# Patient Record
Sex: Male | Born: 1945 | Race: White | Hispanic: Yes | Marital: Married | Smoking: Never smoker
Health system: Southern US, Community
[De-identification: ages and names within clinical notes are randomized; demographics above are authoritative.]

## PROBLEM LIST (undated history)

## (undated) DIAGNOSIS — E669 Obesity, unspecified: Secondary | ICD-10-CM

## (undated) DIAGNOSIS — M7120 Synovial cyst of popliteal space [Baker], unspecified knee: Secondary | ICD-10-CM

## (undated) DIAGNOSIS — R42 Dizziness and giddiness: Secondary | ICD-10-CM

## (undated) DIAGNOSIS — Z923 Personal history of irradiation: Secondary | ICD-10-CM

## (undated) DIAGNOSIS — C61 Malignant neoplasm of prostate: Secondary | ICD-10-CM

## (undated) DIAGNOSIS — M1990 Unspecified osteoarthritis, unspecified site: Secondary | ICD-10-CM

## (undated) DIAGNOSIS — R531 Weakness: Secondary | ICD-10-CM

## (undated) DIAGNOSIS — Z950 Presence of cardiac pacemaker: Secondary | ICD-10-CM

## (undated) DIAGNOSIS — G4733 Obstructive sleep apnea (adult) (pediatric): Secondary | ICD-10-CM

## (undated) DIAGNOSIS — I480 Paroxysmal atrial fibrillation: Secondary | ICD-10-CM

## (undated) DIAGNOSIS — T402X5A Adverse effect of other opioids, initial encounter: Secondary | ICD-10-CM

## (undated) DIAGNOSIS — R351 Nocturia: Secondary | ICD-10-CM

## (undated) DIAGNOSIS — R0602 Shortness of breath: Secondary | ICD-10-CM

## (undated) DIAGNOSIS — K5903 Drug induced constipation: Secondary | ICD-10-CM

## (undated) DIAGNOSIS — Z87442 Personal history of urinary calculi: Secondary | ICD-10-CM

## (undated) DIAGNOSIS — R0600 Dyspnea, unspecified: Secondary | ICD-10-CM

## (undated) DIAGNOSIS — IMO0002 Reserved for concepts with insufficient information to code with codable children: Secondary | ICD-10-CM

## (undated) DIAGNOSIS — R001 Bradycardia, unspecified: Secondary | ICD-10-CM

## (undated) DIAGNOSIS — E785 Hyperlipidemia, unspecified: Secondary | ICD-10-CM

## (undated) DIAGNOSIS — J302 Other seasonal allergic rhinitis: Secondary | ICD-10-CM

## (undated) DIAGNOSIS — I2510 Atherosclerotic heart disease of native coronary artery without angina pectoris: Secondary | ICD-10-CM

## (undated) DIAGNOSIS — M2550 Pain in unspecified joint: Secondary | ICD-10-CM

## (undated) DIAGNOSIS — I499 Cardiac arrhythmia, unspecified: Secondary | ICD-10-CM

## (undated) DIAGNOSIS — R609 Edema, unspecified: Secondary | ICD-10-CM

## (undated) DIAGNOSIS — I495 Sick sinus syndrome: Secondary | ICD-10-CM

## (undated) DIAGNOSIS — E559 Vitamin D deficiency, unspecified: Secondary | ICD-10-CM

## (undated) DIAGNOSIS — R0989 Other specified symptoms and signs involving the circulatory and respiratory systems: Secondary | ICD-10-CM

## (undated) DIAGNOSIS — M549 Dorsalgia, unspecified: Secondary | ICD-10-CM

## (undated) HISTORY — DX: Dyspnea, unspecified: R06.00

## (undated) HISTORY — DX: Reserved for concepts with insufficient information to code with codable children: IMO0002

## (undated) HISTORY — DX: Malignant neoplasm of prostate: C61

## (undated) HISTORY — DX: Atherosclerotic heart disease of native coronary artery without angina pectoris: I25.10

## (undated) HISTORY — DX: Weakness: R53.1

## (undated) HISTORY — DX: Vitamin D deficiency, unspecified: E55.9

## (undated) HISTORY — DX: Nocturia: R35.1

## (undated) HISTORY — DX: Edema, unspecified: R60.9

## (undated) HISTORY — DX: Obesity, unspecified: E66.9

## (undated) HISTORY — DX: Bradycardia, unspecified: R00.1

## (undated) HISTORY — PX: OTHER SURGICAL HISTORY: SHX169

## (undated) HISTORY — DX: Dorsalgia, unspecified: M54.9

## (undated) HISTORY — DX: Dizziness and giddiness: R42

## (undated) HISTORY — DX: Unspecified osteoarthritis, unspecified site: M19.90

## (undated) HISTORY — DX: Shortness of breath: R06.02

## (undated) HISTORY — DX: Hyperlipidemia, unspecified: E78.5

## (undated) HISTORY — DX: Presence of cardiac pacemaker: Z95.0

## (undated) HISTORY — DX: Paroxysmal atrial fibrillation: I48.0

## (undated) HISTORY — DX: Sick sinus syndrome: I49.5

## (undated) HISTORY — DX: Pain in unspecified joint: M25.50

## (undated) HISTORY — DX: Obstructive sleep apnea (adult) (pediatric): G47.33

---

## 2004-10-28 HISTORY — PX: LITHOTRIPSY: SUR834

## 2010-10-28 DIAGNOSIS — I480 Paroxysmal atrial fibrillation: Secondary | ICD-10-CM

## 2010-10-28 DIAGNOSIS — C61 Malignant neoplasm of prostate: Secondary | ICD-10-CM

## 2010-10-28 HISTORY — DX: Paroxysmal atrial fibrillation: I48.0

## 2010-10-28 HISTORY — DX: Malignant neoplasm of prostate: C61

## 2011-10-29 DIAGNOSIS — Z923 Personal history of irradiation: Secondary | ICD-10-CM

## 2011-10-29 DIAGNOSIS — Z950 Presence of cardiac pacemaker: Secondary | ICD-10-CM

## 2011-10-29 HISTORY — DX: Presence of cardiac pacemaker: Z95.0

## 2011-10-29 HISTORY — DX: Personal history of irradiation: Z92.3

## 2011-11-11 HISTORY — PX: PACEMAKER INSERTION: SHX728

## 2012-03-16 HISTORY — PX: CHOLECYSTECTOMY: SHX55

## 2012-10-30 DIAGNOSIS — L570 Actinic keratosis: Secondary | ICD-10-CM | POA: Diagnosis not present

## 2012-10-30 DIAGNOSIS — B079 Viral wart, unspecified: Secondary | ICD-10-CM | POA: Diagnosis not present

## 2012-10-30 DIAGNOSIS — Z85828 Personal history of other malignant neoplasm of skin: Secondary | ICD-10-CM | POA: Diagnosis not present

## 2012-10-30 DIAGNOSIS — Z23 Encounter for immunization: Secondary | ICD-10-CM | POA: Diagnosis not present

## 2012-11-10 DIAGNOSIS — E785 Hyperlipidemia, unspecified: Secondary | ICD-10-CM | POA: Diagnosis not present

## 2012-11-10 DIAGNOSIS — I2510 Atherosclerotic heart disease of native coronary artery without angina pectoris: Secondary | ICD-10-CM | POA: Diagnosis not present

## 2012-11-10 DIAGNOSIS — C61 Malignant neoplasm of prostate: Secondary | ICD-10-CM | POA: Diagnosis not present

## 2012-11-10 DIAGNOSIS — G4730 Sleep apnea, unspecified: Secondary | ICD-10-CM | POA: Diagnosis not present

## 2012-11-10 DIAGNOSIS — R5381 Other malaise: Secondary | ICD-10-CM | POA: Diagnosis not present

## 2012-11-10 DIAGNOSIS — R5383 Other fatigue: Secondary | ICD-10-CM | POA: Diagnosis not present

## 2012-11-11 DIAGNOSIS — R0602 Shortness of breath: Secondary | ICD-10-CM | POA: Diagnosis not present

## 2012-11-11 DIAGNOSIS — E669 Obesity, unspecified: Secondary | ICD-10-CM | POA: Diagnosis not present

## 2012-11-11 DIAGNOSIS — R5383 Other fatigue: Secondary | ICD-10-CM | POA: Diagnosis not present

## 2012-11-11 DIAGNOSIS — G4730 Sleep apnea, unspecified: Secondary | ICD-10-CM | POA: Diagnosis not present

## 2012-11-11 DIAGNOSIS — I2510 Atherosclerotic heart disease of native coronary artery without angina pectoris: Secondary | ICD-10-CM | POA: Diagnosis not present

## 2012-11-11 DIAGNOSIS — E785 Hyperlipidemia, unspecified: Secondary | ICD-10-CM | POA: Diagnosis not present

## 2012-11-11 DIAGNOSIS — M899 Disorder of bone, unspecified: Secondary | ICD-10-CM | POA: Diagnosis not present

## 2012-11-17 DIAGNOSIS — C61 Malignant neoplasm of prostate: Secondary | ICD-10-CM | POA: Diagnosis not present

## 2012-11-17 DIAGNOSIS — I4891 Unspecified atrial fibrillation: Secondary | ICD-10-CM | POA: Diagnosis not present

## 2012-11-17 DIAGNOSIS — E669 Obesity, unspecified: Secondary | ICD-10-CM | POA: Diagnosis not present

## 2012-11-26 DIAGNOSIS — E669 Obesity, unspecified: Secondary | ICD-10-CM | POA: Diagnosis not present

## 2012-11-26 DIAGNOSIS — I4891 Unspecified atrial fibrillation: Secondary | ICD-10-CM | POA: Diagnosis not present

## 2012-11-26 DIAGNOSIS — G4733 Obstructive sleep apnea (adult) (pediatric): Secondary | ICD-10-CM | POA: Diagnosis not present

## 2012-11-26 DIAGNOSIS — J3089 Other allergic rhinitis: Secondary | ICD-10-CM | POA: Diagnosis not present

## 2012-12-09 DIAGNOSIS — L821 Other seborrheic keratosis: Secondary | ICD-10-CM | POA: Diagnosis not present

## 2012-12-09 DIAGNOSIS — L820 Inflamed seborrheic keratosis: Secondary | ICD-10-CM | POA: Diagnosis not present

## 2012-12-14 DIAGNOSIS — M999 Biomechanical lesion, unspecified: Secondary | ICD-10-CM | POA: Diagnosis not present

## 2012-12-14 DIAGNOSIS — M5126 Other intervertebral disc displacement, lumbar region: Secondary | ICD-10-CM | POA: Diagnosis not present

## 2012-12-14 DIAGNOSIS — IMO0002 Reserved for concepts with insufficient information to code with codable children: Secondary | ICD-10-CM | POA: Diagnosis not present

## 2012-12-15 DIAGNOSIS — M999 Biomechanical lesion, unspecified: Secondary | ICD-10-CM | POA: Diagnosis not present

## 2012-12-15 DIAGNOSIS — IMO0002 Reserved for concepts with insufficient information to code with codable children: Secondary | ICD-10-CM | POA: Diagnosis not present

## 2012-12-15 DIAGNOSIS — M5126 Other intervertebral disc displacement, lumbar region: Secondary | ICD-10-CM | POA: Diagnosis not present

## 2012-12-17 DIAGNOSIS — M999 Biomechanical lesion, unspecified: Secondary | ICD-10-CM | POA: Diagnosis not present

## 2012-12-18 DIAGNOSIS — M5126 Other intervertebral disc displacement, lumbar region: Secondary | ICD-10-CM | POA: Diagnosis not present

## 2012-12-18 DIAGNOSIS — IMO0002 Reserved for concepts with insufficient information to code with codable children: Secondary | ICD-10-CM | POA: Diagnosis not present

## 2012-12-18 DIAGNOSIS — M999 Biomechanical lesion, unspecified: Secondary | ICD-10-CM | POA: Diagnosis not present

## 2012-12-21 DIAGNOSIS — M999 Biomechanical lesion, unspecified: Secondary | ICD-10-CM | POA: Diagnosis not present

## 2012-12-21 DIAGNOSIS — IMO0002 Reserved for concepts with insufficient information to code with codable children: Secondary | ICD-10-CM | POA: Diagnosis not present

## 2012-12-25 DIAGNOSIS — M999 Biomechanical lesion, unspecified: Secondary | ICD-10-CM | POA: Diagnosis not present

## 2012-12-30 DIAGNOSIS — M5126 Other intervertebral disc displacement, lumbar region: Secondary | ICD-10-CM | POA: Diagnosis not present

## 2012-12-30 DIAGNOSIS — IMO0002 Reserved for concepts with insufficient information to code with codable children: Secondary | ICD-10-CM | POA: Diagnosis not present

## 2012-12-30 DIAGNOSIS — M999 Biomechanical lesion, unspecified: Secondary | ICD-10-CM | POA: Diagnosis not present

## 2012-12-31 DIAGNOSIS — IMO0002 Reserved for concepts with insufficient information to code with codable children: Secondary | ICD-10-CM | POA: Diagnosis not present

## 2012-12-31 DIAGNOSIS — M5126 Other intervertebral disc displacement, lumbar region: Secondary | ICD-10-CM | POA: Diagnosis not present

## 2013-01-01 DIAGNOSIS — M999 Biomechanical lesion, unspecified: Secondary | ICD-10-CM | POA: Diagnosis not present

## 2013-01-02 DIAGNOSIS — M545 Low back pain, unspecified: Secondary | ICD-10-CM | POA: Diagnosis not present

## 2013-01-06 DIAGNOSIS — R609 Edema, unspecified: Secondary | ICD-10-CM | POA: Diagnosis not present

## 2013-01-19 DIAGNOSIS — M999 Biomechanical lesion, unspecified: Secondary | ICD-10-CM | POA: Diagnosis not present

## 2013-01-19 DIAGNOSIS — IMO0002 Reserved for concepts with insufficient information to code with codable children: Secondary | ICD-10-CM | POA: Diagnosis not present

## 2013-01-19 DIAGNOSIS — M5126 Other intervertebral disc displacement, lumbar region: Secondary | ICD-10-CM | POA: Diagnosis not present

## 2013-01-20 DIAGNOSIS — R609 Edema, unspecified: Secondary | ICD-10-CM

## 2013-01-20 DIAGNOSIS — E669 Obesity, unspecified: Secondary | ICD-10-CM | POA: Diagnosis not present

## 2013-01-20 DIAGNOSIS — I4891 Unspecified atrial fibrillation: Secondary | ICD-10-CM | POA: Diagnosis not present

## 2013-01-20 DIAGNOSIS — M1990 Unspecified osteoarthritis, unspecified site: Secondary | ICD-10-CM | POA: Diagnosis not present

## 2013-01-20 DIAGNOSIS — R0602 Shortness of breath: Secondary | ICD-10-CM

## 2013-01-20 DIAGNOSIS — Z950 Presence of cardiac pacemaker: Secondary | ICD-10-CM | POA: Diagnosis not present

## 2013-01-20 HISTORY — DX: Edema, unspecified: R60.9

## 2013-01-20 HISTORY — DX: Shortness of breath: R06.02

## 2013-01-21 DIAGNOSIS — M5126 Other intervertebral disc displacement, lumbar region: Secondary | ICD-10-CM | POA: Diagnosis not present

## 2013-01-21 DIAGNOSIS — IMO0002 Reserved for concepts with insufficient information to code with codable children: Secondary | ICD-10-CM | POA: Diagnosis not present

## 2013-01-21 DIAGNOSIS — M999 Biomechanical lesion, unspecified: Secondary | ICD-10-CM | POA: Diagnosis not present

## 2013-01-25 DIAGNOSIS — M999 Biomechanical lesion, unspecified: Secondary | ICD-10-CM | POA: Diagnosis not present

## 2013-01-25 DIAGNOSIS — M5126 Other intervertebral disc displacement, lumbar region: Secondary | ICD-10-CM | POA: Diagnosis not present

## 2013-01-25 DIAGNOSIS — IMO0002 Reserved for concepts with insufficient information to code with codable children: Secondary | ICD-10-CM | POA: Diagnosis not present

## 2013-01-28 DIAGNOSIS — IMO0002 Reserved for concepts with insufficient information to code with codable children: Secondary | ICD-10-CM | POA: Diagnosis not present

## 2013-01-28 DIAGNOSIS — M5126 Other intervertebral disc displacement, lumbar region: Secondary | ICD-10-CM | POA: Diagnosis not present

## 2013-01-28 DIAGNOSIS — M999 Biomechanical lesion, unspecified: Secondary | ICD-10-CM | POA: Diagnosis not present

## 2013-02-04 DIAGNOSIS — M5126 Other intervertebral disc displacement, lumbar region: Secondary | ICD-10-CM | POA: Diagnosis not present

## 2013-02-04 DIAGNOSIS — IMO0002 Reserved for concepts with insufficient information to code with codable children: Secondary | ICD-10-CM | POA: Diagnosis not present

## 2013-02-04 DIAGNOSIS — M999 Biomechanical lesion, unspecified: Secondary | ICD-10-CM | POA: Diagnosis not present

## 2013-02-09 DIAGNOSIS — M5126 Other intervertebral disc displacement, lumbar region: Secondary | ICD-10-CM | POA: Diagnosis not present

## 2013-02-09 DIAGNOSIS — M999 Biomechanical lesion, unspecified: Secondary | ICD-10-CM | POA: Diagnosis not present

## 2013-02-09 DIAGNOSIS — IMO0002 Reserved for concepts with insufficient information to code with codable children: Secondary | ICD-10-CM | POA: Diagnosis not present

## 2013-02-15 DIAGNOSIS — C61 Malignant neoplasm of prostate: Secondary | ICD-10-CM | POA: Diagnosis not present

## 2013-02-16 DIAGNOSIS — IMO0002 Reserved for concepts with insufficient information to code with codable children: Secondary | ICD-10-CM | POA: Diagnosis not present

## 2013-02-16 DIAGNOSIS — M999 Biomechanical lesion, unspecified: Secondary | ICD-10-CM | POA: Diagnosis not present

## 2013-02-16 DIAGNOSIS — M5126 Other intervertebral disc displacement, lumbar region: Secondary | ICD-10-CM | POA: Diagnosis not present

## 2013-02-23 DIAGNOSIS — C61 Malignant neoplasm of prostate: Secondary | ICD-10-CM | POA: Diagnosis not present

## 2013-02-23 DIAGNOSIS — M5126 Other intervertebral disc displacement, lumbar region: Secondary | ICD-10-CM | POA: Diagnosis not present

## 2013-02-23 DIAGNOSIS — IMO0002 Reserved for concepts with insufficient information to code with codable children: Secondary | ICD-10-CM | POA: Diagnosis not present

## 2013-02-23 DIAGNOSIS — M999 Biomechanical lesion, unspecified: Secondary | ICD-10-CM | POA: Diagnosis not present

## 2013-02-23 DIAGNOSIS — E669 Obesity, unspecified: Secondary | ICD-10-CM | POA: Diagnosis not present

## 2013-03-02 DIAGNOSIS — M999 Biomechanical lesion, unspecified: Secondary | ICD-10-CM | POA: Diagnosis not present

## 2013-03-02 DIAGNOSIS — IMO0002 Reserved for concepts with insufficient information to code with codable children: Secondary | ICD-10-CM | POA: Diagnosis not present

## 2013-03-02 DIAGNOSIS — M5126 Other intervertebral disc displacement, lumbar region: Secondary | ICD-10-CM | POA: Diagnosis not present

## 2013-03-09 DIAGNOSIS — IMO0002 Reserved for concepts with insufficient information to code with codable children: Secondary | ICD-10-CM | POA: Diagnosis not present

## 2013-03-09 DIAGNOSIS — M5126 Other intervertebral disc displacement, lumbar region: Secondary | ICD-10-CM | POA: Diagnosis not present

## 2013-03-09 DIAGNOSIS — M999 Biomechanical lesion, unspecified: Secondary | ICD-10-CM | POA: Diagnosis not present

## 2013-03-16 DIAGNOSIS — M999 Biomechanical lesion, unspecified: Secondary | ICD-10-CM | POA: Diagnosis not present

## 2013-03-16 DIAGNOSIS — M5126 Other intervertebral disc displacement, lumbar region: Secondary | ICD-10-CM | POA: Diagnosis not present

## 2013-03-16 DIAGNOSIS — IMO0002 Reserved for concepts with insufficient information to code with codable children: Secondary | ICD-10-CM | POA: Diagnosis not present

## 2013-03-23 DIAGNOSIS — IMO0002 Reserved for concepts with insufficient information to code with codable children: Secondary | ICD-10-CM | POA: Diagnosis not present

## 2013-03-23 DIAGNOSIS — M5126 Other intervertebral disc displacement, lumbar region: Secondary | ICD-10-CM | POA: Diagnosis not present

## 2013-03-23 DIAGNOSIS — M999 Biomechanical lesion, unspecified: Secondary | ICD-10-CM | POA: Diagnosis not present

## 2013-03-30 DIAGNOSIS — IMO0002 Reserved for concepts with insufficient information to code with codable children: Secondary | ICD-10-CM | POA: Diagnosis not present

## 2013-03-30 DIAGNOSIS — M5126 Other intervertebral disc displacement, lumbar region: Secondary | ICD-10-CM | POA: Diagnosis not present

## 2013-03-30 DIAGNOSIS — M999 Biomechanical lesion, unspecified: Secondary | ICD-10-CM | POA: Diagnosis not present

## 2013-04-13 DIAGNOSIS — M999 Biomechanical lesion, unspecified: Secondary | ICD-10-CM | POA: Diagnosis not present

## 2013-04-13 DIAGNOSIS — IMO0002 Reserved for concepts with insufficient information to code with codable children: Secondary | ICD-10-CM | POA: Diagnosis not present

## 2013-04-13 DIAGNOSIS — M5126 Other intervertebral disc displacement, lumbar region: Secondary | ICD-10-CM | POA: Diagnosis not present

## 2013-04-27 DIAGNOSIS — M999 Biomechanical lesion, unspecified: Secondary | ICD-10-CM | POA: Diagnosis not present

## 2013-04-27 DIAGNOSIS — IMO0002 Reserved for concepts with insufficient information to code with codable children: Secondary | ICD-10-CM | POA: Diagnosis not present

## 2013-04-27 DIAGNOSIS — M5126 Other intervertebral disc displacement, lumbar region: Secondary | ICD-10-CM | POA: Diagnosis not present

## 2013-04-28 DIAGNOSIS — E785 Hyperlipidemia, unspecified: Secondary | ICD-10-CM | POA: Diagnosis not present

## 2013-04-28 DIAGNOSIS — L851 Acquired keratosis [keratoderma] palmaris et plantaris: Secondary | ICD-10-CM | POA: Diagnosis not present

## 2013-04-28 DIAGNOSIS — R110 Nausea: Secondary | ICD-10-CM | POA: Diagnosis not present

## 2013-04-28 DIAGNOSIS — I2510 Atherosclerotic heart disease of native coronary artery without angina pectoris: Secondary | ICD-10-CM | POA: Diagnosis not present

## 2013-04-29 DIAGNOSIS — D235 Other benign neoplasm of skin of trunk: Secondary | ICD-10-CM | POA: Diagnosis not present

## 2013-04-29 DIAGNOSIS — I8310 Varicose veins of unspecified lower extremity with inflammation: Secondary | ICD-10-CM | POA: Diagnosis not present

## 2013-04-29 DIAGNOSIS — Z85828 Personal history of other malignant neoplasm of skin: Secondary | ICD-10-CM | POA: Diagnosis not present

## 2013-05-05 DIAGNOSIS — M999 Biomechanical lesion, unspecified: Secondary | ICD-10-CM | POA: Diagnosis not present

## 2013-05-05 DIAGNOSIS — IMO0002 Reserved for concepts with insufficient information to code with codable children: Secondary | ICD-10-CM | POA: Diagnosis not present

## 2013-05-05 DIAGNOSIS — M5126 Other intervertebral disc displacement, lumbar region: Secondary | ICD-10-CM | POA: Diagnosis not present

## 2013-08-05 ENCOUNTER — Ambulatory Visit (INDEPENDENT_AMBULATORY_CARE_PROVIDER_SITE_OTHER): Payer: Medicare Other | Admitting: *Deleted

## 2013-08-05 DIAGNOSIS — I4891 Unspecified atrial fibrillation: Secondary | ICD-10-CM

## 2013-08-05 LAB — PACEMAKER DEVICE OBSERVATION
BATTERY VOLTAGE: 2.9629 V
BRDY-0002RV: 60 {beats}/min
RV LEAD AMPLITUDE: 12 mv
RV LEAD IMPEDENCE PM: 562.5 Ohm
VENTRICULAR PACING PM: 72

## 2013-08-05 NOTE — Progress Notes (Signed)
Pacemaker check in clinic. Normal device function. Thresholds, sensing, impedances consistent with previous measurements. Device programmed to maximize longevity.No high ventricular rates noted.  A-fib, patient is intollerant anticoagulants.   Device programmed at appropriate safety margins. Histogram distribution appropriate for patient activity level. Device programmed to optimize intrinsic conduction. Estimated longevity9.5 years. Patient education completed.  ROV in January with Dr. Ladona Ridgel.

## 2013-08-12 ENCOUNTER — Encounter: Payer: Self-pay | Admitting: Internal Medicine

## 2013-10-27 ENCOUNTER — Encounter: Payer: Self-pay | Admitting: *Deleted

## 2013-11-08 ENCOUNTER — Encounter: Payer: Self-pay | Admitting: Internal Medicine

## 2013-11-09 ENCOUNTER — Encounter: Payer: Self-pay | Admitting: Internal Medicine

## 2013-11-09 ENCOUNTER — Ambulatory Visit (INDEPENDENT_AMBULATORY_CARE_PROVIDER_SITE_OTHER): Payer: Medicare Other | Admitting: Internal Medicine

## 2013-11-09 VITALS — BP 136/70 | HR 59 | Ht 74.0 in | Wt 302.0 lb

## 2013-11-09 DIAGNOSIS — I4891 Unspecified atrial fibrillation: Secondary | ICD-10-CM | POA: Insufficient documentation

## 2013-11-09 DIAGNOSIS — I5032 Chronic diastolic (congestive) heart failure: Secondary | ICD-10-CM

## 2013-11-09 DIAGNOSIS — Z950 Presence of cardiac pacemaker: Secondary | ICD-10-CM | POA: Diagnosis not present

## 2013-11-09 DIAGNOSIS — I442 Atrioventricular block, complete: Secondary | ICD-10-CM | POA: Diagnosis not present

## 2013-11-09 LAB — MDC_IDC_ENUM_SESS_TYPE_INCLINIC
Battery Voltage: 2.96 V
Implantable Pulse Generator Model: 1210
Lead Channel Impedance Value: 587.5 Ohm
Lead Channel Pacing Threshold Amplitude: 1 V
Lead Channel Pacing Threshold Pulse Width: 0.4 ms
Lead Channel Sensing Intrinsic Amplitude: 12 mV
Lead Channel Setting Pacing Amplitude: 2.5 V
Lead Channel Setting Pacing Pulse Width: 0.4 ms
MDC IDC MSMT BATTERY REMAINING LONGEVITY: 110.4 mo
MDC IDC PG SERIAL: 7287976
MDC IDC SESS DTM: 20150113101729
MDC IDC SET LEADCHNL RV SENSING SENSITIVITY: 0.5 mV
MDC IDC STAT BRADY RV PERCENT PACED: 81 %

## 2013-11-09 NOTE — Progress Notes (Signed)
HPI Marcus Rojas is referred today by Dr. Marlou Porch for ongoing evaluation and management of his PPM. He is a pleasant 68 yo man with a h/o chronic atrial fibrillation who underwent permanent pacemaker insertion over a year ago, almost 2 years ago in [place]. He has chronic atrial fibrillation at this point. He has not had syncope. He is pacing approximately 80% of the time. The patient had problems with morbid obesity and has lost 40 pounds in the last 6 months. He is considering moving back to [place] once he is lost more weight. Since his pacemaker was placed, he has not had syncope. He does have chronic peripheral edema, and admits to dietary indiscretion with sodium and fluid. He has class II heart failure symptoms and known preserved left ventricular function. No Known Allergies   Current Outpatient Prescriptions  Medication Sig Dispense Refill  . aspirin 325 MG tablet Take 325 mg by mouth daily.      . Cyanocobalamin (VITAMIN B 12 PO) Take 1-2 tablets (sublingual) daily      . diltiazem (CARDIZEM SR) 120 MG 12 hr capsule Take 120 mg by mouth daily.      . furosemide (LASIX) 40 MG tablet Take 40 mg by mouth daily.      . Glucosamine-Chondroitin 1500-1200 MG/30ML LIQD Take 2 tablets by mouth daily.       . Multiple Vitamins-Minerals (MULTIVITAMIN WITH MINERALS) tablet Take 1 tablet by mouth daily.      . NON FORMULARY (Tumeric) Take 2 capsules a day      . OIL OF OREGANO PO Take 5-8 drops by mouth daily.       . Omega-3 Fatty Acids (OMEGA-3 FISH OIL PO) Take 3 tablets by mouth daily      . potassium chloride SA (K-DUR,KLOR-CON) 20 MEQ tablet Take 20 mEq by mouth daily.      . simvastatin (ZOCOR) 20 MG tablet Take 20 mg by mouth every evening.      . tamsulosin (FLOMAX) 0.4 MG CAPS capsule Take 0.4 mg by mouth daily.      Marland Kitchen VITAMIN D, CHOLECALCIFEROL, PO Take 6,000 Units by mouth daily.       No current facility-administered medications for this visit.     Past Medical History    Diagnosis Date  . Prostate cancer 2012  . Obesity   . PAF (paroxysmal atrial fibrillation) 2012    HOLTER (4.2 sec pause good rate CTL). ECHO 09/29/11 - EF 55%  . Cardiac pacemaker in situ 2013    St. Jude - Bradycardia  . Bradycardia     PPM St. Jude insertion in 2013  . Hypertension   . DDD (degenerative disc disease)     lumbar  . SSS (sick sinus syndrome)   . Dyspnea   . Hyperlipidemia   . Edema 01/20/13  . Gallstones     "sludge"  . Nocturia   . Joint pain   . Back pain   . Weakness   . Dizziness   . Kidney stones     1980's & 2000's  . OSA (obstructive sleep apnea)   . Vitamin D deficiency   . Coronary arteriosclerosis   . Osteoarthritis   . SOB (shortness of breath) on exertion 01/20/13    ROS:   All systems reviewed and negative except as noted in the HPI.   Past Surgical History  Procedure Laterality Date  . Pacemaker insertion  11/11/11    St. Jude - Bradycardia  .  Cholecystectomy  03/16/12     Family History  Problem Relation Age of Onset  . Aneurysm Mother   . Heart disease Mother   . CAD Mother   . Bradycardia Mother   . Heart failure Mother   . Emphysema Father   . Cancer Father     lung  . COPD Father   . Obesity Sister   . Diabetes Sister   . Aneurysm Sister   . Diabetes Brother   . Heart disease Maternal Grandmother   . Bradycardia Maternal Grandmother      History   Social History  . Marital Status: Married    Spouse Name: N/A    Number of Children: N/A  . Years of Education: N/A   Occupational History  . Not on file.   Social History Main Topics  . Smoking status: Never Smoker   . Smokeless tobacco: Not on file  . Alcohol Use: No  . Drug Use: No  . Sexual Activity: Not on file   Other Topics Concern  . Not on file   Social History Narrative  . No narrative on file     BP 136/70  Pulse 59  Ht 6\' 2"  (1.88 m)  Wt 302 lb (136.986 kg)  BMI 38.76 kg/m2  Physical Exam:  Well appearing morbidly obese,  68 year old man, NAD HEENT: Unremarkable Neck:  No JVD, no thyromegally Back:  No CVA tenderness Lungs:  Clear with no wheezes, rales, or rhonchi. HEART:  IRegular rate rhythm, no murmurs, no rubs, no clicks Abd:  soft, obese, positive bowel sounds, no organomegally, no rebound, no guarding Ext:  2 plus pulses, 2+ peripheral edema, no cyanosis, no clubbing Skin:  No rashes no nodules Neuro:  CN II through XII intact, motor grossly intact  EKG - atrial fibrillation with ventricular pacing  DEVICE  Normal device function.  See PaceArt for details.   Assess/Plan:

## 2013-11-09 NOTE — Assessment & Plan Note (Signed)
I discussed the importance of weight loss with the patient. He states that he is lost 40 pounds in hopes to lose an additional 40 pounds.

## 2013-11-09 NOTE — Patient Instructions (Signed)

## 2013-11-09 NOTE — Assessment & Plan Note (Signed)
His St. Jude single chamber pacemaker is working normally. We'll plan to recheck in several months. He has approximately 9 years of battery longevity

## 2013-11-09 NOTE — Assessment & Plan Note (Signed)
His ventricular rate is well controlled. He'll continue his current medical therapy.

## 2013-11-24 DIAGNOSIS — E785 Hyperlipidemia, unspecified: Secondary | ICD-10-CM | POA: Diagnosis not present

## 2013-11-24 DIAGNOSIS — M25569 Pain in unspecified knee: Secondary | ICD-10-CM | POA: Diagnosis not present

## 2013-11-24 DIAGNOSIS — I10 Essential (primary) hypertension: Secondary | ICD-10-CM | POA: Diagnosis not present

## 2013-11-24 DIAGNOSIS — M5137 Other intervertebral disc degeneration, lumbosacral region: Secondary | ICD-10-CM | POA: Diagnosis not present

## 2013-11-25 ENCOUNTER — Encounter: Payer: Self-pay | Admitting: Internal Medicine

## 2013-12-01 DIAGNOSIS — M7120 Synovial cyst of popliteal space [Baker], unspecified knee: Secondary | ICD-10-CM | POA: Diagnosis not present

## 2013-12-01 DIAGNOSIS — M1710 Unilateral primary osteoarthritis, unspecified knee: Secondary | ICD-10-CM | POA: Diagnosis not present

## 2013-12-01 DIAGNOSIS — M2390 Unspecified internal derangement of unspecified knee: Secondary | ICD-10-CM | POA: Diagnosis not present

## 2013-12-01 DIAGNOSIS — M2240 Chondromalacia patellae, unspecified knee: Secondary | ICD-10-CM | POA: Diagnosis not present

## 2013-12-01 DIAGNOSIS — IMO0002 Reserved for concepts with insufficient information to code with codable children: Secondary | ICD-10-CM | POA: Diagnosis not present

## 2013-12-07 DIAGNOSIS — M2390 Unspecified internal derangement of unspecified knee: Secondary | ICD-10-CM | POA: Diagnosis not present

## 2013-12-07 DIAGNOSIS — M7120 Synovial cyst of popliteal space [Baker], unspecified knee: Secondary | ICD-10-CM | POA: Diagnosis not present

## 2013-12-07 DIAGNOSIS — M2240 Chondromalacia patellae, unspecified knee: Secondary | ICD-10-CM | POA: Diagnosis not present

## 2013-12-07 DIAGNOSIS — M25569 Pain in unspecified knee: Secondary | ICD-10-CM | POA: Diagnosis not present

## 2013-12-16 DIAGNOSIS — G4733 Obstructive sleep apnea (adult) (pediatric): Secondary | ICD-10-CM | POA: Diagnosis not present

## 2013-12-16 DIAGNOSIS — M25569 Pain in unspecified knee: Secondary | ICD-10-CM | POA: Diagnosis not present

## 2013-12-29 DIAGNOSIS — M1710 Unilateral primary osteoarthritis, unspecified knee: Secondary | ICD-10-CM | POA: Diagnosis not present

## 2013-12-29 DIAGNOSIS — M2240 Chondromalacia patellae, unspecified knee: Secondary | ICD-10-CM | POA: Diagnosis not present

## 2013-12-29 DIAGNOSIS — M2390 Unspecified internal derangement of unspecified knee: Secondary | ICD-10-CM | POA: Diagnosis not present

## 2013-12-29 DIAGNOSIS — IMO0002 Reserved for concepts with insufficient information to code with codable children: Secondary | ICD-10-CM | POA: Diagnosis not present

## 2013-12-29 DIAGNOSIS — M7120 Synovial cyst of popliteal space [Baker], unspecified knee: Secondary | ICD-10-CM | POA: Diagnosis not present

## 2014-01-03 DIAGNOSIS — Z8546 Personal history of malignant neoplasm of prostate: Secondary | ICD-10-CM | POA: Diagnosis not present

## 2014-01-06 ENCOUNTER — Ambulatory Visit: Payer: Self-pay | Admitting: Cardiology

## 2014-01-07 DIAGNOSIS — G4733 Obstructive sleep apnea (adult) (pediatric): Secondary | ICD-10-CM | POA: Diagnosis not present

## 2014-01-10 DIAGNOSIS — C61 Malignant neoplasm of prostate: Secondary | ICD-10-CM | POA: Diagnosis not present

## 2014-01-25 ENCOUNTER — Ambulatory Visit (INDEPENDENT_AMBULATORY_CARE_PROVIDER_SITE_OTHER): Payer: Medicare Other | Admitting: Cardiology

## 2014-01-25 ENCOUNTER — Encounter: Payer: Self-pay | Admitting: Cardiology

## 2014-01-25 VITALS — BP 134/72 | HR 76 | Ht 74.0 in | Wt 288.0 lb

## 2014-01-25 DIAGNOSIS — Z950 Presence of cardiac pacemaker: Secondary | ICD-10-CM | POA: Diagnosis not present

## 2014-01-25 DIAGNOSIS — E669 Obesity, unspecified: Secondary | ICD-10-CM | POA: Diagnosis not present

## 2014-01-25 DIAGNOSIS — I4891 Unspecified atrial fibrillation: Secondary | ICD-10-CM | POA: Diagnosis not present

## 2014-01-25 NOTE — Progress Notes (Signed)
[place]. [address] [place], [place]  [address] Phone: [phone] Fax:  [phone]  Date:  01/25/2014   ID:  Marcus Rojas, DOB 05-10-1946, MRN [id number]  PCP:  Marcus primary provider on file.   History of Present Illness: Marcus Rojas is a 68 y.o. male with paroxysmal atrial fibrillation in 2012 discovered prior to prostate surgery, 4.2 second pause on Holter monitor bleeding to pacemaker, single lead St. Jude placement and 2013 here for followup. He has chronic back pain, chronic dyspnea but this has improved quite a bit with exercise.  Echocardiogram demonstrated normal ejection fraction with only mild valvular abnormalities. Obesity. He has been working with a Restaurant manager, fast food, water aerobics, dietary plan. He moved up to the area to focus on his own health. He is staying with family members. He has lost a significant amount of weight.    Wt Readings from Last 3 Encounters:  01/25/14 288 lb (130.636 kg)  11/09/13 302 lb (136.986 kg)     Past Medical History  Diagnosis Date  . Prostate cancer 2012  . Obesity   . PAF (paroxysmal atrial fibrillation) 2012    HOLTER (4.2 sec pause good rate CTL). ECHO 09/29/11 - EF 55%  . Cardiac pacemaker in situ 2013    St. Jude - Bradycardia  . Bradycardia     PPM St. Jude insertion in 2013  . Hypertension   . DDD (degenerative disc disease)     lumbar  . SSS (sick sinus syndrome)   . Dyspnea   . Hyperlipidemia   . Edema 01/20/13  . Gallstones     "sludge"  . Nocturia   . Joint pain   . Back pain   . Weakness   . Dizziness   . Kidney stones     1980's & 2000's  . OSA (obstructive sleep apnea)   . Vitamin D deficiency   . Coronary arteriosclerosis   . Osteoarthritis   . SOB (shortness of breath) on exertion 01/20/13    Past Surgical History  Procedure Laterality Date  . Pacemaker insertion  11/11/11    St. Jude - Bradycardia  . Cholecystectomy  03/16/12    Current Outpatient Prescriptions  Medication Sig  Dispense Refill  . aspirin 325 MG tablet Take 325 mg by mouth daily.      Marland Kitchen diltiazem (CARDIZEM SR) 120 MG 12 hr capsule Take 120 mg by mouth daily.      . furosemide (LASIX) 40 MG tablet Take 40 mg by mouth daily.      . Glucosamine-Chondroitin 1500-1200 MG/30ML LIQD Take 2 tablets by mouth daily.       . Multiple Vitamins-Minerals (MULTIVITAMIN WITH MINERALS) tablet Take 1 tablet by mouth daily.      . NON FORMULARY (Tumeric) Take 2 capsules a day      . OIL OF OREGANO PO Take 5-8 drops by mouth daily.       . Omega-3 Fatty Acids (OMEGA-3 FISH OIL PO) Take 3 tablets by mouth daily      . potassium chloride SA (K-DUR,KLOR-CON) 20 MEQ tablet Take 20 mEq by mouth daily.      . simvastatin (ZOCOR) 20 MG tablet Take 20 mg by mouth every evening.      . tamsulosin (FLOMAX) 0.4 MG CAPS capsule Take 0.4 mg by mouth daily.      Marland Kitchen VITAMIN D, CHOLECALCIFEROL, PO Take 6,000 Units by mouth daily.       Marcus  current facility-administered medications for this visit.    Rojas:   Marcus Rojas  Social History:  The patient  reports that he has never smoked. He does not have any smokeless tobacco history on file. He reports that he does not drink alcohol or use illicit drugs.   ROS:  Please see the history of present illness.   Occasionally will feel some left chest cramping when using a noodle in the swimming pole.  PHYSICAL EXAM: VS:  BP 134/72  Pulse 76  Ht 6\' 2"  (1.88 m)  Wt 288 lb (130.636 kg)  BMI 36.96 kg/m2 Well nourished, well developed, in Marcus acute distress HEENT: normal Neck: Marcus JVD Cardiac:  normal S1, S2; RRR; Marcus murmur Lungs:  clear to auscultation bilaterally, Marcus wheezing, rhonchi or rales Abd: soft, nontender, Marcus hepatomegaly Ext: Marcus edema Skin: warm and dry Neuro: Marcus focal abnormalities noted  EKG:  None today  ASSESSMENT AND PLAN:  1. Atrial fibrillation-previously to monitor showed Marcus evidence of atrial fibrillation. Pacemaker will continue to monitor this. Well  controlled. 2. Pacemaker-doing well. 9 years of battery. 3. Obesity-doing a great job with weight loss. 4. One year follow  Signed, Marcus Furbish, MD [facility]  01/25/2014 2:43 PM

## 2014-01-25 NOTE — Patient Instructions (Signed)
Your physician recommends that you continue on your current medications as directed. Please refer to the Current Medication list given to you today.  Your physician wants you to follow-up in: 1 year with Dr. Skains. You will receive a reminder letter in the mail two months in advance. If you don't receive a letter, please call our office to schedule the follow-up appointment.  

## 2014-02-10 ENCOUNTER — Ambulatory Visit (INDEPENDENT_AMBULATORY_CARE_PROVIDER_SITE_OTHER): Payer: Medicare Other | Admitting: *Deleted

## 2014-02-10 ENCOUNTER — Encounter: Payer: Self-pay | Admitting: Internal Medicine

## 2014-02-10 DIAGNOSIS — I4891 Unspecified atrial fibrillation: Secondary | ICD-10-CM | POA: Diagnosis not present

## 2014-02-14 LAB — MDC_IDC_ENUM_SESS_TYPE_REMOTE
Date Time Interrogation Session: 20150416063159
Implantable Pulse Generator Model: 1210
Lead Channel Impedance Value: 560 Ohm
Lead Channel Pacing Threshold Amplitude: 1 V
Lead Channel Pacing Threshold Pulse Width: 0.4 ms
Lead Channel Setting Sensing Sensitivity: 0.5 mV
MDC IDC MSMT BATTERY REMAINING LONGEVITY: 112 mo
MDC IDC MSMT BATTERY VOLTAGE: 2.96 V
MDC IDC MSMT LEADCHNL RV SENSING INTR AMPL: 11.4 mV
MDC IDC PG SERIAL: 7287976
MDC IDC SET LEADCHNL RV PACING AMPLITUDE: 2.5 V
MDC IDC SET LEADCHNL RV PACING PULSEWIDTH: 0.4 ms
MDC IDC STAT BRADY RV PERCENT PACED: 82 %

## 2014-02-24 DIAGNOSIS — M549 Dorsalgia, unspecified: Secondary | ICD-10-CM | POA: Diagnosis not present

## 2014-02-24 DIAGNOSIS — I10 Essential (primary) hypertension: Secondary | ICD-10-CM | POA: Diagnosis not present

## 2014-02-24 DIAGNOSIS — E785 Hyperlipidemia, unspecified: Secondary | ICD-10-CM | POA: Diagnosis not present

## 2014-02-24 DIAGNOSIS — I4891 Unspecified atrial fibrillation: Secondary | ICD-10-CM | POA: Diagnosis not present

## 2014-02-24 DIAGNOSIS — C61 Malignant neoplasm of prostate: Secondary | ICD-10-CM | POA: Diagnosis not present

## 2014-03-02 ENCOUNTER — Encounter: Payer: Self-pay | Admitting: Cardiology

## 2014-03-15 ENCOUNTER — Telehealth: Payer: Self-pay | Admitting: Cardiology

## 2014-03-15 DIAGNOSIS — G4733 Obstructive sleep apnea (adult) (pediatric): Secondary | ICD-10-CM | POA: Diagnosis not present

## 2014-03-15 NOTE — Telephone Encounter (Signed)
Simvastatin must be limited to 10 mg when used with diltiazem.  He is currently on simvastatin 20 mg qd.  Given patient has CAD and LDL was 106 mg/dL in 05/2013, patient needs to stop simvastatin and instead change to atorvastatin 40 mg qd.  Recheck lipid panel and hepatic panel in 6 weeks.  Please notify patient, update meds, and set up lab. Thanks.

## 2014-03-15 NOTE — Telephone Encounter (Signed)
New message     Want Marcus Rojas Pt said there is a conflict of diltiazem from Dr Marlou Porch and  simavastatin prescribe from PCP.  Dr Orland Mustard told patient to call and talk to you about this problem

## 2014-03-17 MED ORDER — ATORVASTATIN CALCIUM 40 MG PO TABS: 40.0000 mg | ORAL_TABLET | Freq: Every day | ORAL | Status: DC

## 2014-03-17 NOTE — Telephone Encounter (Signed)
Spoke with patient advised to discontinue simvastatin 20 and start Atorvastatin 40 once daily. Sent to pharmacy. Patient concerned about price and will call if the Rx isn't cost effective for him.

## 2014-05-16 ENCOUNTER — Ambulatory Visit (INDEPENDENT_AMBULATORY_CARE_PROVIDER_SITE_OTHER): Payer: Medicare Other | Admitting: *Deleted

## 2014-05-16 DIAGNOSIS — I4891 Unspecified atrial fibrillation: Secondary | ICD-10-CM | POA: Diagnosis not present

## 2014-05-16 NOTE — Progress Notes (Signed)
Remote pacemaker transmission.   

## 2014-05-23 LAB — MDC_IDC_ENUM_SESS_TYPE_REMOTE
Battery Remaining Longevity: 97 mo
Battery Remaining Percentage: 74 %
Battery Voltage: 2.95 V
Brady Statistic RV Percent Paced: 84 %
Date Time Interrogation Session: 20150720070303
Implantable Pulse Generator Serial Number: 7287976
Lead Channel Pacing Threshold Amplitude: 1 V
Lead Channel Pacing Threshold Pulse Width: 0.4 ms
Lead Channel Setting Sensing Sensitivity: 0.5 mV
MDC IDC MSMT LEADCHNL RV IMPEDANCE VALUE: 510 Ohm
MDC IDC MSMT LEADCHNL RV SENSING INTR AMPL: 12 mV
MDC IDC SET LEADCHNL RV PACING AMPLITUDE: 2.5 V
MDC IDC SET LEADCHNL RV PACING PULSEWIDTH: 0.4 ms

## 2014-06-03 ENCOUNTER — Encounter: Payer: Self-pay | Admitting: Cardiology

## 2014-06-15 ENCOUNTER — Encounter: Payer: Self-pay | Admitting: Internal Medicine

## 2014-06-17 DIAGNOSIS — Z0000 Encounter for general adult medical examination without abnormal findings: Secondary | ICD-10-CM | POA: Diagnosis not present

## 2014-06-17 DIAGNOSIS — F329 Major depressive disorder, single episode, unspecified: Secondary | ICD-10-CM | POA: Diagnosis not present

## 2014-06-17 DIAGNOSIS — E559 Vitamin D deficiency, unspecified: Secondary | ICD-10-CM | POA: Diagnosis not present

## 2014-06-17 DIAGNOSIS — F3289 Other specified depressive episodes: Secondary | ICD-10-CM | POA: Diagnosis not present

## 2014-06-17 DIAGNOSIS — I10 Essential (primary) hypertension: Secondary | ICD-10-CM | POA: Diagnosis not present

## 2014-06-17 DIAGNOSIS — Z1211 Encounter for screening for malignant neoplasm of colon: Secondary | ICD-10-CM | POA: Diagnosis not present

## 2014-06-17 DIAGNOSIS — E785 Hyperlipidemia, unspecified: Secondary | ICD-10-CM | POA: Diagnosis not present

## 2014-06-17 DIAGNOSIS — G4733 Obstructive sleep apnea (adult) (pediatric): Secondary | ICD-10-CM | POA: Diagnosis not present

## 2014-06-17 DIAGNOSIS — C61 Malignant neoplasm of prostate: Secondary | ICD-10-CM | POA: Diagnosis not present

## 2014-07-18 DIAGNOSIS — R5381 Other malaise: Secondary | ICD-10-CM | POA: Diagnosis not present

## 2014-07-18 DIAGNOSIS — Z23 Encounter for immunization: Secondary | ICD-10-CM | POA: Diagnosis not present

## 2014-07-18 DIAGNOSIS — F3289 Other specified depressive episodes: Secondary | ICD-10-CM | POA: Diagnosis not present

## 2014-07-18 DIAGNOSIS — R5383 Other fatigue: Secondary | ICD-10-CM | POA: Diagnosis not present

## 2014-07-18 DIAGNOSIS — F329 Major depressive disorder, single episode, unspecified: Secondary | ICD-10-CM | POA: Diagnosis not present

## 2014-08-02 ENCOUNTER — Other Ambulatory Visit: Payer: Self-pay | Admitting: *Deleted

## 2014-08-02 MED ORDER — DILTIAZEM HCL ER 120 MG PO CP12: 120.0000 mg | ORAL_CAPSULE | Freq: Every day | ORAL | Status: DC

## 2014-08-03 ENCOUNTER — Other Ambulatory Visit: Payer: Self-pay | Admitting: *Deleted

## 2014-08-03 MED ORDER — FUROSEMIDE 40 MG PO TABS: 40.0000 mg | ORAL_TABLET | Freq: Every day | ORAL | Status: DC

## 2014-08-04 ENCOUNTER — Other Ambulatory Visit: Payer: Self-pay

## 2014-08-04 ENCOUNTER — Other Ambulatory Visit: Payer: Self-pay | Admitting: *Deleted

## 2014-08-04 ENCOUNTER — Telehealth: Payer: Self-pay | Admitting: *Deleted

## 2014-08-04 MED ORDER — POTASSIUM CHLORIDE CRYS ER 20 MEQ PO TBCR: 20.0000 meq | EXTENDED_RELEASE_TABLET | Freq: Every day | ORAL | Status: DC

## 2014-08-04 MED ORDER — DILTIAZEM HCL ER 120 MG PO CP24: 120.0000 mg | ORAL_CAPSULE | Freq: Every day | ORAL | Status: DC

## 2014-08-04 MED ORDER — DILTIAZEM HCL ER 120 MG PO CP12: 120.0000 mg | ORAL_CAPSULE | Freq: Every day | ORAL | Status: DC

## 2014-08-04 NOTE — Telephone Encounter (Signed)
[facility] notes show 24 hr capsule, therefore, it was probably added into epic incorrectly. Refill 24 hour capsule.

## 2014-08-04 NOTE — Telephone Encounter (Signed)
Is this patient supposed to be taking the diltiazem 12hr or the 24hr  capsule? His office notes indicates that he is on the 12hr, but the pharmacy states that he has been on the 24hr. Please advise. Thanks, MI

## 2014-08-10 MED ORDER — DILTIAZEM HCL ER 120 MG PO CP24: 120.0000 mg | ORAL_CAPSULE | Freq: Every day | ORAL | Status: DC

## 2014-08-10 NOTE — Addendum Note (Signed)
Addended by: Shellia Cleverly on: 08/10/2014 10:24 AM   Modules accepted: Orders

## 2014-08-19 DIAGNOSIS — C61 Malignant neoplasm of prostate: Secondary | ICD-10-CM | POA: Diagnosis not present

## 2014-08-22 ENCOUNTER — Ambulatory Visit (INDEPENDENT_AMBULATORY_CARE_PROVIDER_SITE_OTHER): Payer: Medicare Other | Admitting: *Deleted

## 2014-08-22 DIAGNOSIS — I4891 Unspecified atrial fibrillation: Secondary | ICD-10-CM | POA: Diagnosis not present

## 2014-08-22 NOTE — Progress Notes (Signed)
Remote pacemaker transmission.   

## 2014-08-24 LAB — MDC_IDC_ENUM_SESS_TYPE_REMOTE
Battery Remaining Longevity: 106 mo
Battery Remaining Percentage: 81 %
Brady Statistic RV Percent Paced: 85 %
Implantable Pulse Generator Serial Number: 7287976
Lead Channel Pacing Threshold Pulse Width: 0.4 ms
Lead Channel Sensing Intrinsic Amplitude: 12 mV
Lead Channel Setting Pacing Amplitude: 2.5 V
Lead Channel Setting Pacing Pulse Width: 0.4 ms
Lead Channel Setting Sensing Sensitivity: 0.5 mV
MDC IDC MSMT BATTERY VOLTAGE: 2.96 V
MDC IDC MSMT LEADCHNL RV IMPEDANCE VALUE: 490 Ohm
MDC IDC MSMT LEADCHNL RV PACING THRESHOLD AMPLITUDE: 1 V
MDC IDC SESS DTM: 20151026073456

## 2014-08-25 DIAGNOSIS — N401 Enlarged prostate with lower urinary tract symptoms: Secondary | ICD-10-CM | POA: Diagnosis not present

## 2014-08-25 DIAGNOSIS — R3912 Poor urinary stream: Secondary | ICD-10-CM | POA: Diagnosis not present

## 2014-08-25 DIAGNOSIS — C61 Malignant neoplasm of prostate: Secondary | ICD-10-CM | POA: Diagnosis not present

## 2014-08-30 DIAGNOSIS — D122 Benign neoplasm of ascending colon: Secondary | ICD-10-CM | POA: Diagnosis not present

## 2014-08-30 DIAGNOSIS — D123 Benign neoplasm of transverse colon: Secondary | ICD-10-CM | POA: Diagnosis not present

## 2014-08-30 DIAGNOSIS — Z1211 Encounter for screening for malignant neoplasm of colon: Secondary | ICD-10-CM | POA: Diagnosis not present

## 2014-08-30 DIAGNOSIS — D124 Benign neoplasm of descending colon: Secondary | ICD-10-CM | POA: Diagnosis not present

## 2014-09-15 ENCOUNTER — Encounter: Payer: Self-pay | Admitting: Cardiology

## 2014-09-20 ENCOUNTER — Encounter: Payer: Self-pay | Admitting: Internal Medicine

## 2014-10-05 DIAGNOSIS — M25562 Pain in left knee: Secondary | ICD-10-CM | POA: Diagnosis not present

## 2014-10-05 DIAGNOSIS — M2242 Chondromalacia patellae, left knee: Secondary | ICD-10-CM | POA: Diagnosis not present

## 2014-10-05 DIAGNOSIS — M1712 Unilateral primary osteoarthritis, left knee: Secondary | ICD-10-CM | POA: Diagnosis not present

## 2014-10-05 DIAGNOSIS — M7122 Synovial cyst of popliteal space [Baker], left knee: Secondary | ICD-10-CM | POA: Diagnosis not present

## 2014-10-10 DIAGNOSIS — M25569 Pain in unspecified knee: Secondary | ICD-10-CM | POA: Diagnosis not present

## 2014-10-10 DIAGNOSIS — M549 Dorsalgia, unspecified: Secondary | ICD-10-CM | POA: Diagnosis not present

## 2014-10-10 DIAGNOSIS — E785 Hyperlipidemia, unspecified: Secondary | ICD-10-CM | POA: Diagnosis not present

## 2014-10-10 DIAGNOSIS — I10 Essential (primary) hypertension: Secondary | ICD-10-CM | POA: Diagnosis not present

## 2014-10-14 DIAGNOSIS — M545 Low back pain: Secondary | ICD-10-CM | POA: Diagnosis not present

## 2014-10-14 DIAGNOSIS — M5136 Other intervertebral disc degeneration, lumbar region: Secondary | ICD-10-CM | POA: Diagnosis not present

## 2014-10-14 DIAGNOSIS — M4806 Spinal stenosis, lumbar region: Secondary | ICD-10-CM | POA: Diagnosis not present

## 2014-10-14 DIAGNOSIS — M629 Disorder of muscle, unspecified: Secondary | ICD-10-CM | POA: Diagnosis not present

## 2014-10-19 DIAGNOSIS — M4806 Spinal stenosis, lumbar region: Secondary | ICD-10-CM | POA: Diagnosis not present

## 2014-10-24 DIAGNOSIS — M4806 Spinal stenosis, lumbar region: Secondary | ICD-10-CM | POA: Diagnosis not present

## 2014-10-27 DIAGNOSIS — M4806 Spinal stenosis, lumbar region: Secondary | ICD-10-CM | POA: Diagnosis not present

## 2014-10-31 DIAGNOSIS — M4806 Spinal stenosis, lumbar region: Secondary | ICD-10-CM | POA: Diagnosis not present

## 2014-11-03 DIAGNOSIS — M4806 Spinal stenosis, lumbar region: Secondary | ICD-10-CM | POA: Diagnosis not present

## 2014-11-04 ENCOUNTER — Other Ambulatory Visit: Payer: Self-pay | Admitting: Cardiology

## 2014-11-07 DIAGNOSIS — M4806 Spinal stenosis, lumbar region: Secondary | ICD-10-CM | POA: Diagnosis not present

## 2014-11-10 DIAGNOSIS — M4806 Spinal stenosis, lumbar region: Secondary | ICD-10-CM | POA: Diagnosis not present

## 2014-11-14 DIAGNOSIS — M4806 Spinal stenosis, lumbar region: Secondary | ICD-10-CM | POA: Diagnosis not present

## 2014-11-15 ENCOUNTER — Ambulatory Visit (INDEPENDENT_AMBULATORY_CARE_PROVIDER_SITE_OTHER): Payer: Medicare Other | Admitting: Internal Medicine

## 2014-11-15 ENCOUNTER — Encounter: Payer: Self-pay | Admitting: Internal Medicine

## 2014-11-15 ENCOUNTER — Telehealth: Payer: Self-pay | Admitting: *Deleted

## 2014-11-15 VITALS — BP 116/62 | HR 71 | Ht 73.0 in | Wt 306.2 lb

## 2014-11-15 DIAGNOSIS — I482 Chronic atrial fibrillation, unspecified: Secondary | ICD-10-CM

## 2014-11-15 DIAGNOSIS — Z950 Presence of cardiac pacemaker: Secondary | ICD-10-CM | POA: Diagnosis not present

## 2014-11-15 LAB — MDC_IDC_ENUM_SESS_TYPE_INCLINIC
Battery Remaining Longevity: 116.4 mo
Battery Voltage: 2.95 V
Implantable Pulse Generator Model: 1210
Lead Channel Pacing Threshold Amplitude: 1 V
Lead Channel Pacing Threshold Pulse Width: 0.4 ms
Lead Channel Pacing Threshold Pulse Width: 0.4 ms
Lead Channel Setting Pacing Amplitude: 2.5 V
Lead Channel Setting Sensing Sensitivity: 0.5 mV
MDC IDC MSMT LEADCHNL RV IMPEDANCE VALUE: 550 Ohm
MDC IDC MSMT LEADCHNL RV PACING THRESHOLD AMPLITUDE: 1 V
MDC IDC MSMT LEADCHNL RV SENSING INTR AMPL: 12 mV
MDC IDC PG SERIAL: 7287976
MDC IDC SESS DTM: 20160119100852
MDC IDC SET LEADCHNL RV PACING PULSEWIDTH: 0.4 ms
MDC IDC STAT BRADY RV PERCENT PACED: 83 %

## 2014-11-15 NOTE — Assessment & Plan Note (Signed)
His St. Jude PPM is working normally. Will recheck in several months.  

## 2014-11-15 NOTE — Telephone Encounter (Signed)
Disregard entry; error.

## 2014-11-15 NOTE — Assessment & Plan Note (Addendum)
He will continue his current meds. It is unclear to me as to why the patient is not systemically anti-coagulated. Will ask his primary cardiologist.

## 2014-11-15 NOTE — Telephone Encounter (Signed)
Pt believes he heard his device alert him. He successfully sent a remote. Remote does not show alerts or episodes.   Pt aware if his alert tone has activated, it will alert him every 10 hours. Pt will pay attention at 10pm tonight and 8am tomorrow morning (1st tone believed to have occurred at noon today). Pt will call clinic if he hears it again.

## 2014-11-15 NOTE — Progress Notes (Signed)
HPI Marcus Rojas returns today for ongoing evaluation and management of his PPM. He is a pleasant 69 yo man with a h/o chronic atrial fibrillation who underwent permanent pacemaker insertion 3 years ago in [place]. He has chronic atrial fibrillation at this point. He has not had syncope. He is pacing approximately 80% of the time. The patient had problems with obesity and is trying to lose weight. Since his pacemaker was placed, he has not had syncope. He does have chronic peripheral edema, and admits to dietary indiscretion with sodium and fluid. He has class II heart failure symptoms and known preserved left ventricular function. No Known Allergies   Current Outpatient Prescriptions  Medication Sig Dispense Refill  . aspirin 325 MG tablet Take 325 mg by mouth daily.    . cyclobenzaprine (FLEXERIL) 5 MG tablet Take 5 mg by mouth 3 (three) times daily as needed for muscle spasms.    Marland Kitchen diltiazem (CARDIZEM CD) 120 MG 24 hr capsule TAKE ONE CAPSULE BY MOUTH ONCE DAILY 90 capsule 0  . furosemide (LASIX) 40 MG tablet TAKE ONE TABLET BY MOUTH ONCE DAILY 90 tablet 0  . Glucosamine-Chondroitin 1500-1200 MG/30ML LIQD Take 2 tablets by mouth daily.     Marland Kitchen KLOR-CON M20 20 MEQ tablet TAKE ONE TABLET BY MOUTH ONCE DAILY 90 tablet 0  . Multiple Vitamins-Minerals (MULTIVITAMIN WITH MINERALS) tablet Take 1 tablet by mouth daily.    . NON FORMULARY (Tumeric) Take 2 capsules by mouth daily    . OIL OF OREGANO PO Take 5-8 drops by mouth daily.     . Omega-3 Fatty Acids (OMEGA-3 FISH OIL PO) Take 3 tablets by mouth daily    . simvastatin (ZOCOR) 10 MG tablet Take 1 tablet by mouth daily.    . tamsulosin (FLOMAX) 0.4 MG CAPS capsule Take 0.4 mg by mouth daily.    . traMADol (ULTRAM) 50 MG tablet Take 50 mg by mouth every 6 (six) hours as needed (pain).    Marland Kitchen VITAMIN D, CHOLECALCIFEROL, PO Take 1,000 Units by mouth daily.      No current facility-administered medications for this visit.     Past Medical  History  Diagnosis Date  . Prostate cancer 2012  . Obesity   . PAF (paroxysmal atrial fibrillation) 2012    HOLTER (4.2 sec pause good rate CTL). ECHO 09/29/11 - EF 55%  . Cardiac pacemaker in situ 2013    St. Jude - Bradycardia  . Bradycardia     PPM St. Jude insertion in 2013  . Hypertension   . DDD (degenerative disc disease)     lumbar  . SSS (sick sinus syndrome)   . Dyspnea   . Hyperlipidemia   . Edema 01/20/13  . Gallstones     "sludge"  . Nocturia   . Joint pain   . Back pain   . Weakness   . Dizziness   . Kidney stones     1980's & 2000's  . OSA (obstructive sleep apnea)   . Vitamin D deficiency   . Coronary arteriosclerosis   . Osteoarthritis   . SOB (shortness of breath) on exertion 01/20/13    ROS:   All systems reviewed and negative except as noted in the HPI.   Past Surgical History  Procedure Laterality Date  . Pacemaker insertion  11/11/11    St. Jude - Bradycardia  . Cholecystectomy  03/16/12     Family History  Problem Relation Age of Onset  .  Aneurysm Mother   . Heart disease Mother   . CAD Mother   . Bradycardia Mother   . Heart failure Mother   . Emphysema Father   . Cancer Father     lung  . COPD Father   . Obesity Sister   . Diabetes Sister   . Aneurysm Sister   . Diabetes Brother   . Heart disease Maternal Grandmother   . Bradycardia Maternal Grandmother      History   Social History  . Marital Status: Married    Spouse Name: N/A    Number of Children: N/A  . Years of Education: N/A   Occupational History  . Not on file.   Social History Main Topics  . Smoking status: Never Smoker   . Smokeless tobacco: Not on file  . Alcohol Use: No  . Drug Use: No  . Sexual Activity: Not on file   Other Topics Concern  . Not on file   Social History Narrative     BP 116/62 mmHg  Pulse 71  Ht 6\' 1"  (1.854 m)  Wt 306 lb 3.2 oz (138.891 kg)  BMI 40.41 kg/m2  Physical Exam:  Well appearing obese, 69 year old man,  NAD HEENT: Unremarkable Neck:  No JVD, no thyromegally Back:  No CVA tenderness Lungs:  Clear with no wheezes, rales, or rhonchi. HEART:  IRegular rate rhythm, no murmurs, no rubs, no clicks Abd:  soft, obese, positive bowel sounds, no organomegally, no rebound, no guarding Ext:  2 plus pulses, 1+ peripheral edema, no cyanosis, no clubbing Skin:  No rashes no nodules Neuro:  CN II through XII intact, motor grossly intact   DEVICE  Normal device function.  See PaceArt for details.   Assess/Plan:

## 2014-11-15 NOTE — Patient Instructions (Signed)
Remote monitoring is used to monitor your Pacemaker or ICD from home. This monitoring reduces the number of office visits required to check your device to one time per year. It allows [place] to keep an eye on the functioning of your device to ensure it is working properly. You are scheduled for a device check from home on 02/14/2015. You may send your transmission at any time that day. If you have a wireless device, the transmission will be sent automatically. After your physician reviews your transmission, you will receive a postcard with your next transmission date.  Your physician wants you to follow-up in: 1 year with Dr. Knox Saliva will receive a reminder letter in the mail two months in advance. If you don't receive a letter, please call our office to schedule the follow-up appointment.  Your physician recommends that you continue on your current medications as directed. Please refer to the Current Medication list given to you today.

## 2014-11-17 DIAGNOSIS — M4806 Spinal stenosis, lumbar region: Secondary | ICD-10-CM | POA: Diagnosis not present

## 2014-11-21 ENCOUNTER — Telehealth: Payer: Self-pay | Admitting: *Deleted

## 2014-11-21 NOTE — Telephone Encounter (Signed)
Called and left a message for pt to call by to discuss why he is not on any anticoagulation.

## 2014-11-22 NOTE — Telephone Encounter (Signed)
Left pt a message to call back. 

## 2014-11-22 NOTE — Telephone Encounter (Signed)
Follow Up ° ° °Pt is returning call from yesterday. Please call. °

## 2014-11-23 ENCOUNTER — Encounter: Payer: Self-pay | Admitting: Internal Medicine

## 2014-11-23 NOTE — Telephone Encounter (Signed)
Spoke with Marcus Rojas who reports he had previously taken Xarelto and stopped ASA.  When he stopped the ASA he started having tremendous back pain.  He doesn't know if taking the Xarelto caused the pain or stopping the ASA caused the pain.  He reports having had a conversation about this information at his last office visit with Dr Marlou Porch and it was decided that he would remain on ASA 325 mg as his blood thinner because "everything was fine."  Advised I will forward this information to Dr Marlou Porch and call back if any new orders or recommendations are given.  Marcus Rojas is in agreement.

## 2014-11-24 NOTE — Telephone Encounter (Signed)
Continue with aspirin. No Xarelto unless atrial fibrillation shows up again on pacemaker. He does run increased risk of stroke if atrial fibrillation does return however.

## 2014-11-25 DIAGNOSIS — M545 Low back pain: Secondary | ICD-10-CM | POA: Diagnosis not present

## 2014-11-25 DIAGNOSIS — M4806 Spinal stenosis, lumbar region: Secondary | ICD-10-CM | POA: Diagnosis not present

## 2014-11-25 DIAGNOSIS — M5136 Other intervertebral disc degeneration, lumbar region: Secondary | ICD-10-CM | POA: Diagnosis not present

## 2014-12-14 DIAGNOSIS — M4806 Spinal stenosis, lumbar region: Secondary | ICD-10-CM | POA: Diagnosis not present

## 2014-12-14 DIAGNOSIS — Z6841 Body Mass Index (BMI) 40.0 and over, adult: Secondary | ICD-10-CM | POA: Diagnosis not present

## 2014-12-15 ENCOUNTER — Other Ambulatory Visit: Payer: Self-pay | Admitting: Neurosurgery

## 2014-12-15 DIAGNOSIS — M48061 Spinal stenosis, lumbar region without neurogenic claudication: Secondary | ICD-10-CM

## 2014-12-20 ENCOUNTER — Emergency Department (HOSPITAL_COMMUNITY)
Admission: EM | Admit: 2014-12-20 | Discharge: 2014-12-20 | Disposition: A | Discharge status: Home / Self Care | Payer: Medicare Other | Attending: Emergency Medicine | Admitting: Emergency Medicine

## 2014-12-20 ENCOUNTER — Encounter (HOSPITAL_COMMUNITY): Payer: Self-pay | Admitting: Family Medicine

## 2014-12-20 DIAGNOSIS — M545 Low back pain, unspecified: Secondary | ICD-10-CM

## 2014-12-20 DIAGNOSIS — E785 Hyperlipidemia, unspecified: Secondary | ICD-10-CM | POA: Diagnosis not present

## 2014-12-20 DIAGNOSIS — Z8669 Personal history of other diseases of the nervous system and sense organs: Secondary | ICD-10-CM | POA: Diagnosis not present

## 2014-12-20 DIAGNOSIS — E669 Obesity, unspecified: Secondary | ICD-10-CM | POA: Diagnosis not present

## 2014-12-20 DIAGNOSIS — I2510 Atherosclerotic heart disease of native coronary artery without angina pectoris: Secondary | ICD-10-CM | POA: Insufficient documentation

## 2014-12-20 DIAGNOSIS — I480 Paroxysmal atrial fibrillation: Secondary | ICD-10-CM | POA: Insufficient documentation

## 2014-12-20 DIAGNOSIS — Z7982 Long term (current) use of aspirin: Secondary | ICD-10-CM | POA: Insufficient documentation

## 2014-12-20 DIAGNOSIS — Z79899 Other long term (current) drug therapy: Secondary | ICD-10-CM | POA: Insufficient documentation

## 2014-12-20 DIAGNOSIS — S3992XA Unspecified injury of lower back, initial encounter: Secondary | ICD-10-CM | POA: Diagnosis not present

## 2014-12-20 DIAGNOSIS — M549 Dorsalgia, unspecified: Secondary | ICD-10-CM | POA: Diagnosis not present

## 2014-12-20 DIAGNOSIS — Z87442 Personal history of urinary calculi: Secondary | ICD-10-CM | POA: Diagnosis not present

## 2014-12-20 DIAGNOSIS — I10 Essential (primary) hypertension: Secondary | ICD-10-CM | POA: Diagnosis not present

## 2014-12-20 DIAGNOSIS — M1990 Unspecified osteoarthritis, unspecified site: Secondary | ICD-10-CM | POA: Insufficient documentation

## 2014-12-20 DIAGNOSIS — Z8719 Personal history of other diseases of the digestive system: Secondary | ICD-10-CM | POA: Diagnosis not present

## 2014-12-20 DIAGNOSIS — Z950 Presence of cardiac pacemaker: Secondary | ICD-10-CM | POA: Diagnosis not present

## 2014-12-20 DIAGNOSIS — Z8546 Personal history of malignant neoplasm of prostate: Secondary | ICD-10-CM | POA: Diagnosis not present

## 2014-12-20 DIAGNOSIS — E559 Vitamin D deficiency, unspecified: Secondary | ICD-10-CM | POA: Insufficient documentation

## 2014-12-20 MED ORDER — IBUPROFEN 800 MG PO TABS: 800.0000 mg | ORAL_TABLET | Freq: Three times a day (TID) | ORAL | Status: AC

## 2014-12-20 MED ORDER — DIAZEPAM 5 MG PO TABS: 5.0000 mg | ORAL_TABLET | Freq: Two times a day (BID) | ORAL | Status: AC

## 2014-12-20 MED ORDER — KETOROLAC TROMETHAMINE 30 MG/ML IJ SOLN
30.0000 mg | Freq: Once | INTRAMUSCULAR | Status: AC
  Administered 2014-12-20: 30 mg via INTRAMUSCULAR
  Filled 2014-12-20: qty 1

## 2014-12-20 MED ORDER — HYDROCODONE-ACETAMINOPHEN 5-325 MG PO TABS
1.0000 | ORAL_TABLET | ORAL | Status: DC | PRN
Start: 2014-12-20 — End: 2015-03-17

## 2014-12-20 MED ORDER — DIAZEPAM 5 MG PO TABS
5.0000 mg | ORAL_TABLET | Freq: Once | ORAL | Status: AC
  Administered 2014-12-20: 5 mg via ORAL
  Filled 2014-12-20: qty 1

## 2014-12-20 MED ORDER — HYDROMORPHONE HCL 1 MG/ML IJ SOLN
1.0000 mg | Freq: Once | INTRAMUSCULAR | Status: AC
  Administered 2014-12-20: 1 mg via INTRAMUSCULAR
  Filled 2014-12-20: qty 1

## 2014-12-20 NOTE — ED Provider Notes (Signed)
CSN: [id number]     Arrival date & time 12/20/14  1014 History   First MD Initiated Contact with Patient 12/20/14 1015     Chief Complaint  Patient presents with  . Back Pain     HPI Patient presents with concern of ongoing back pain. Pain is focally in the lumbar spine. Patient has been present for"a long time"but worse over the past 7 weeks, with new fall, trauma, activity. Patient is nonradiating, sore, severe, worse with ambulation. Patient has been seeing neurosurgery. Patient has a pacemaker, and that has Located his efforts to obtain MRI. Patient denies any new loss of sensation or specific weakness in either distal lower extremity, and also denies any incontinence, abdominal pain. No fevers, chills. No history of prior back surgery.  Past Medical History  Diagnosis Date  . Prostate cancer 2012  . Obesity   . PAF (paroxysmal atrial fibrillation) 2012    HOLTER (4.2 sec pause good rate CTL). ECHO 09/29/11 - EF 55%  . Cardiac pacemaker in situ 2013    St. Jude - Bradycardia  . Bradycardia     PPM St. Jude insertion in 2013  . Hypertension   . DDD (degenerative disc disease)     lumbar  . SSS (sick sinus syndrome)   . Dyspnea   . Hyperlipidemia   . Edema 01/20/13  . Gallstones     "sludge"  . Nocturia   . Joint pain   . Back pain   . Weakness   . Dizziness   . Kidney stones     1980's & 2000's  . OSA (obstructive sleep apnea)   . Vitamin D deficiency   . Coronary arteriosclerosis   . Osteoarthritis   . SOB (shortness of breath) on exertion 01/20/13   Past Surgical History  Procedure Laterality Date  . Pacemaker insertion  11/11/11    St. Jude - Bradycardia  . Cholecystectomy  03/16/12   Family History  Problem Relation Age of Onset  . Aneurysm Mother   . Heart disease Mother   . CAD Mother   . Bradycardia Mother   . Heart failure Mother   . Emphysema Father   . Cancer Father     lung  . COPD Father   . Obesity Sister   . Diabetes Sister   .  Aneurysm Sister   . Diabetes Brother   . Heart disease Maternal Grandmother   . Bradycardia Maternal Grandmother    History  Substance Use Topics  . Smoking status: Never Smoker   . Smokeless tobacco: Not on file  . Alcohol Use: No    Review of Systems  Constitutional:       Per HPI, otherwise negative  HENT:       Per HPI, otherwise negative  Respiratory:       Per HPI, otherwise negative  Cardiovascular:       Per HPI, otherwise negative  Gastrointestinal: Negative for vomiting.  Endocrine:       Negative aside from HPI  Genitourinary:       Neg aside from HPI   Musculoskeletal:       Per HPI, otherwise negative  Skin: Negative for color change.  Neurological: Negative for syncope.      Allergies  Review of patient's allergies indicates no known allergies.  Home Medications   Prior to Admission medications   Medication Sig Start Date End Date Taking? Authorizing Provider  aspirin 325 MG tablet Take 325 mg by mouth daily.  Historical Provider, MD  cyclobenzaprine (FLEXERIL) 5 MG tablet Take 5 mg by mouth 3 (three) times daily as needed for muscle spasms.    Historical Provider, MD  diltiazem (CARDIZEM CD) 120 MG 24 hr capsule TAKE ONE CAPSULE BY MOUTH ONCE DAILY 11/04/14   Candee Furbish, MD  furosemide (LASIX) 40 MG tablet TAKE ONE TABLET BY MOUTH ONCE DAILY 11/04/14   Candee Furbish, MD  Glucosamine-Chondroitin 1500-1200 MG/30ML LIQD Take 2 tablets by mouth daily.     Historical Provider, MD  KLOR-CON M20 20 MEQ tablet TAKE ONE TABLET BY MOUTH ONCE DAILY 11/04/14   Candee Furbish, MD  Multiple Vitamins-Minerals (MULTIVITAMIN WITH MINERALS) tablet Take 1 tablet by mouth daily.    Historical Provider, MD  NON FORMULARY (Tumeric) Take 2 capsules by mouth daily    Historical Provider, MD  OIL OF OREGANO PO Take 5-8 drops by mouth daily.     Historical Provider, MD  Omega-3 Fatty Acids (OMEGA-3 FISH OIL PO) Take 3 tablets by mouth daily    Historical Provider, MD  simvastatin  (ZOCOR) 10 MG tablet Take 1 tablet by mouth daily. 11/07/14   Historical Provider, MD  tamsulosin (FLOMAX) 0.4 MG CAPS capsule Take 0.4 mg by mouth daily.    Historical Provider, MD  traMADol (ULTRAM) 50 MG tablet Take 50 mg by mouth every 6 (six) hours as needed (pain).    Historical Provider, MD  VITAMIN D, CHOLECALCIFEROL, PO Take 1,000 Units by mouth daily.     Historical Provider, MD   BP 140/98 mmHg  Pulse 60  Temp(Src) 97.5 F (36.4 C) (Oral)  Resp 20  SpO2 100% Physical Exam  Constitutional: He is oriented to person, place, and time. He appears well-developed. No distress.  Large male resting in bed, no distress  HENT:  Head: Normocephalic and atraumatic.  Eyes: Conjunctivae and EOM are normal.  Cardiovascular: Normal rate and regular rhythm.   Pulmonary/Chest: Effort normal. No stridor. No respiratory distress.  Abdominal: He exhibits no distension. There is no tenderness. There is no rebound.  Musculoskeletal: He exhibits no edema.  Patient can spontaneously flex each hip independently, passive straight leg pain with minor flexion of either hip approximately 20. No back deformity  Neurological: He is alert and oriented to person, place, and time. No cranial nerve deficit. He exhibits normal muscle tone. Coordination normal.  Patient has appropriate strength and sensation in both feet and ankles, strength is 5/5 in both lower extremity proximally and distally.  Reflexes are appropriate.  Skin: Skin is warm and dry.  Psychiatric: He has a normal mood and affect.  Nursing note and vitals reviewed.   ED Course  Procedures (including critical care time)   After the initial evaluation I discussed the patient's case with our radiology team. Patient's pacemaker is not compatible with MRI machines here.  1:46 PM Patient is pain free  I reviewed the patient's records from his physicians in [place]. Patient had CT within 3 years demonstrated multiple lumbar lesions, though no  complete occlusion.  MDM  Patient presents with ongoing low back pain.  Pain improved substantially here with multiple medication. Patient's records were reviewed, and we attempted to arrange inpatient MRI, which is not available for him due to his pacemaker. Patient has a neurosurgeon with whom he is currently working to schedule outpatient MRI. With resolution of his pain, and no red flags concerning for neurologic compromise, he was discharged in stable condition with a course of analgesics, neurosurgery f/u.  Carmin Muskrat, MD 12/20/14 1357

## 2014-12-20 NOTE — Discharge Instructions (Signed)
As discussed, your evaluation today has been largely reassuring.  But, it is important that you monitor your condition carefully, and do not hesitate to return to the ED if you develop new, or concerning changes in your condition. ? ?Otherwise, please follow-up with your physician for appropriate ongoing care. ? ?

## 2014-12-20 NOTE — ED Notes (Signed)
Pt's sister brought in medical records/imaging records for patient.  The records given to Dr. Vanita Panda for review.

## 2014-12-20 NOTE — ED Notes (Signed)
Pt presents from home via [facility] with c/o back pain - pt reports "3rd and 4th lumbar, that's where the problem is and that's where the pain is".  Pt states pain became unbearable today  even after taking prescribed pain medications and was unable to ambulate this morning.  Pt reports has been seeing Dr. Annette Stable and Dr. Drema Dallas for this pain that has been ongoing x8 weeks and is to be getting an MRI in the future. Pt was able to stand and transfer from EMS stretcher to ED stretcher.  Pt also reports RUE and RLE movement makes pain worse.  Pt with hx sleep apnea and Afib with an implanted pacemaker.

## 2014-12-20 NOTE — ED Notes (Addendum)
Pt comfortable with discharge and follow up instructions. Prescriptions x3.

## 2014-12-27 ENCOUNTER — Other Ambulatory Visit: Payer: Self-pay | Admitting: Neurosurgery

## 2014-12-27 DIAGNOSIS — M48061 Spinal stenosis, lumbar region without neurogenic claudication: Secondary | ICD-10-CM

## 2014-12-29 ENCOUNTER — Telehealth: Payer: Self-pay | Admitting: *Deleted

## 2014-12-29 NOTE — Telephone Encounter (Signed)
   After review of his most recent pacemaker interrogation and most recent EKG, he is in AFIB and should be on anticoagulation. Please either restart Xarelto or if he is hesitant to use this medication, may start Eliquis 5 mg twice a day.        Candee Furbish, MD            ----- Message -----     From: [name], MD     Sent: 11/15/2014 10:36 AM      To: Candee Furbish, MD        [name], it was not clear why he is not taking systemic anti-coagulation. Do you have any thoughts on his coumadin/NOAC candidacy?     Spoke with patient who is aware he needs to restart Xarelto.  He reports he came off the Xarelto because of back pain that he thought occurred because he stopped ASA.  He is now having trouble with back pain again even though he is still on ASA.  He is scheduled for a myelogram 3/9 and wants to wait to restart Xarelto then.  He reports he has about 180 days worth of Xarelto now and does not need a RX sent into his pharmacy.  I requested he call back and let me know that he has restarted it when he does.  He states understanding and that he will call back to let me know.

## 2015-01-04 ENCOUNTER — Ambulatory Visit
Admission: RE | Admit: 2015-01-04 | Discharge: 2015-01-04 | Disposition: A | Discharge status: Home / Self Care | Payer: Medicare Other | Source: Ambulatory Visit | Attending: Neurosurgery | Admitting: Neurosurgery

## 2015-01-04 DIAGNOSIS — M48061 Spinal stenosis, lumbar region without neurogenic claudication: Secondary | ICD-10-CM

## 2015-01-04 DIAGNOSIS — M5126 Other intervertebral disc displacement, lumbar region: Secondary | ICD-10-CM | POA: Diagnosis not present

## 2015-01-04 DIAGNOSIS — M545 Low back pain: Secondary | ICD-10-CM | POA: Diagnosis not present

## 2015-01-04 MED ORDER — DIAZEPAM 5 MG PO TABS
10.0000 mg | ORAL_TABLET | Freq: Once | ORAL | Status: AC
  Administered 2015-01-04: 10 mg via ORAL

## 2015-01-04 MED ORDER — ONDANSETRON HCL 4 MG/2ML IJ SOLN: 4.0000 mg | Freq: Four times a day (QID) | INTRAMUSCULAR | Status: DC | PRN

## 2015-01-04 MED ORDER — IOHEXOL 180 MG/ML  SOLN
15.0000 mL | Freq: Once | INTRAMUSCULAR | Status: AC | PRN
  Administered 2015-01-04: 15 mL via INTRATHECAL

## 2015-01-04 NOTE — Discharge Instructions (Addendum)
Myelogram Discharge Instructions  1. Go home and rest quietly for the next 24 hours.  It is important to lie flat for the next 24 hours.  Get up only to go to the restroom.  You may lie in the bed or on a couch on your back, your stomach, your left side or your right side.  You may have one pillow under your head.  You may have pillows between your knees while you are on your side or under your knees while you are on your back.  2. DO NOT drive today.  Recline the seat as far back as it will go, while still wearing your seat belt, on the way home.  3. You may get up to go to the bathroom as needed.  You may sit up for 10 minutes to eat.  You may resume your normal diet and medications unless otherwise indicated.  Drink lots of extra fluids today and tomorrow.  4. The incidence of headache, nausea, or vomiting is about 5% (one in 20 patients).  If you develop a headache, lie flat and drink plenty of fluids until the headache goes away.  Caffeinated beverages may be helpful.  If you develop severe nausea and vomiting or a headache that does not go away with flat bed rest, call [phone].  5. You may resume normal activities after your 24 hours of bed rest is over; however, do not exert yourself strongly or do any heavy lifting tomorrow. If when you get up you have a headache when standing, go back to bed and force fluids for another 24 hours.  6. Call your physician for a follow-up appointment.  The results of your myelogram will be sent directly to your physician by the following day.  7. If you have any questions or if complications develop after you arrive home, please call [phone].  Discharge instructions have been explained to the patient.  The patient, or the person responsible for the patient, fully understands these instructions.      You may resume Tramadol on January 05, 2015 after 2:00p.m.

## 2015-01-11 ENCOUNTER — Other Ambulatory Visit: Payer: Self-pay | Admitting: Neurosurgery

## 2015-01-11 DIAGNOSIS — M4806 Spinal stenosis, lumbar region: Secondary | ICD-10-CM | POA: Diagnosis not present

## 2015-01-11 DIAGNOSIS — Z6841 Body Mass Index (BMI) 40.0 and over, adult: Secondary | ICD-10-CM | POA: Diagnosis not present

## 2015-01-30 ENCOUNTER — Telehealth: Payer: Self-pay | Admitting: Cardiology

## 2015-01-30 NOTE — Telephone Encounter (Signed)
Pt is aware he was given surgical clearance for back surgery 01/17/15 and the paperwork was faxed back to Dr [facility] office on 01/18/15.  He is going to hold his ASA and start back after surgery.  We also discussed again the importance going back on Xarelto d/t his At fib.  He states he will consider it and call back to discuss after his back surgery.

## 2015-01-30 NOTE — Telephone Encounter (Signed)
PT CALLING TO FIND O [facility] IF WE KNEW HE IS HAVING BACK SURGERY AND HAS TO OFF ASA FOR 5 DAYS--PLS CALL [phone]

## 2015-01-31 ENCOUNTER — Other Ambulatory Visit: Payer: Self-pay | Admitting: Cardiology

## 2015-01-31 DIAGNOSIS — I872 Venous insufficiency (chronic) (peripheral): Secondary | ICD-10-CM | POA: Diagnosis not present

## 2015-01-31 DIAGNOSIS — L821 Other seborrheic keratosis: Secondary | ICD-10-CM | POA: Diagnosis not present

## 2015-01-31 DIAGNOSIS — L570 Actinic keratosis: Secondary | ICD-10-CM | POA: Diagnosis not present

## 2015-01-31 DIAGNOSIS — L739 Follicular disorder, unspecified: Secondary | ICD-10-CM | POA: Diagnosis not present

## 2015-01-31 DIAGNOSIS — Z8546 Personal history of malignant neoplasm of prostate: Secondary | ICD-10-CM | POA: Diagnosis not present

## 2015-01-31 DIAGNOSIS — Z85828 Personal history of other malignant neoplasm of skin: Secondary | ICD-10-CM | POA: Diagnosis not present

## 2015-01-31 DIAGNOSIS — I8310 Varicose veins of unspecified lower extremity with inflammation: Secondary | ICD-10-CM | POA: Diagnosis not present

## 2015-01-31 DIAGNOSIS — D485 Neoplasm of uncertain behavior of skin: Secondary | ICD-10-CM | POA: Diagnosis not present

## 2015-02-03 ENCOUNTER — Encounter (HOSPITAL_COMMUNITY)
Admission: RE | Admit: 2015-02-03 | Discharge: 2015-02-03 | Disposition: A | Discharge status: Home / Self Care | Payer: Medicare Other | Source: Ambulatory Visit | Attending: Neurosurgery | Admitting: Neurosurgery

## 2015-02-03 ENCOUNTER — Encounter (HOSPITAL_COMMUNITY): Payer: Self-pay

## 2015-02-03 DIAGNOSIS — I2510 Atherosclerotic heart disease of native coronary artery without angina pectoris: Secondary | ICD-10-CM | POA: Diagnosis not present

## 2015-02-03 DIAGNOSIS — Z950 Presence of cardiac pacemaker: Secondary | ICD-10-CM | POA: Insufficient documentation

## 2015-02-03 DIAGNOSIS — I482 Chronic atrial fibrillation: Secondary | ICD-10-CM | POA: Diagnosis not present

## 2015-02-03 DIAGNOSIS — M4806 Spinal stenosis, lumbar region: Secondary | ICD-10-CM | POA: Insufficient documentation

## 2015-02-03 DIAGNOSIS — Z01812 Encounter for preprocedural laboratory examination: Secondary | ICD-10-CM | POA: Insufficient documentation

## 2015-02-03 DIAGNOSIS — Z79899 Other long term (current) drug therapy: Secondary | ICD-10-CM | POA: Diagnosis not present

## 2015-02-03 DIAGNOSIS — G4733 Obstructive sleep apnea (adult) (pediatric): Secondary | ICD-10-CM | POA: Diagnosis not present

## 2015-02-03 DIAGNOSIS — R9431 Abnormal electrocardiogram [ECG] [EKG]: Secondary | ICD-10-CM | POA: Diagnosis not present

## 2015-02-03 DIAGNOSIS — Z7982 Long term (current) use of aspirin: Secondary | ICD-10-CM | POA: Insufficient documentation

## 2015-02-03 DIAGNOSIS — I10 Essential (primary) hypertension: Secondary | ICD-10-CM | POA: Diagnosis not present

## 2015-02-03 DIAGNOSIS — Z01818 Encounter for other preprocedural examination: Secondary | ICD-10-CM | POA: Diagnosis present

## 2015-02-03 HISTORY — DX: Presence of cardiac pacemaker: Z95.0

## 2015-02-03 HISTORY — DX: Adverse effect of other opioids, initial encounter: T40.2X5A

## 2015-02-03 HISTORY — DX: Other specified symptoms and signs involving the circulatory and respiratory systems: R09.89

## 2015-02-03 HISTORY — DX: Cardiac arrhythmia, unspecified: I49.9

## 2015-02-03 HISTORY — DX: Drug induced constipation: K59.03

## 2015-02-03 HISTORY — DX: Personal history of irradiation: Z92.3

## 2015-02-03 HISTORY — DX: Personal history of urinary calculi: Z87.442

## 2015-02-03 HISTORY — DX: Synovial cyst of popliteal space (Baker), unspecified knee: M71.20

## 2015-02-03 HISTORY — DX: Other seasonal allergic rhinitis: J30.2

## 2015-02-03 LAB — BASIC METABOLIC PANEL
ANION GAP: 8 (ref 5–15)
BUN: 14 mg/dL (ref 6–23)
CO2: 29 mmol/L (ref 19–32)
Calcium: 9.2 mg/dL (ref 8.4–10.5)
Chloride: 103 mmol/L (ref 96–112)
Creatinine, Ser: 1.05 mg/dL (ref 0.50–1.35)
GFR calc non Af Amer: 71 mL/min — ABNORMAL LOW (ref >90)
GFR, EST AFRICAN AMERICAN: 82 mL/min — AB (ref >90)
GLUCOSE: 122 mg/dL — AB (ref 70–99)
Potassium: 3.9 mmol/L (ref 3.5–5.1)
Sodium: 140 mmol/L (ref 135–145)

## 2015-02-03 LAB — CBC WITH DIFFERENTIAL/PLATELET
BASOS ABS: 0 10*3/uL (ref 0.0–0.1)
Basophils Relative: 0 % (ref 0–1)
EOS PCT: 3 % (ref 0–5)
Eosinophils Absolute: 0.2 10*3/uL (ref 0.0–0.7)
HCT: 45.4 % (ref 39.0–52.0)
HEMOGLOBIN: 15.3 g/dL (ref 13.0–17.0)
Lymphocytes Relative: 39 % (ref 12–46)
Lymphs Abs: 2.4 10*3/uL (ref 0.7–4.0)
MCH: 30.8 pg (ref 26.0–34.0)
MCHC: 33.7 g/dL (ref 30.0–36.0)
MCV: 91.5 fL (ref 78.0–100.0)
Monocytes Absolute: 0.5 10*3/uL (ref 0.1–1.0)
Monocytes Relative: 8 % (ref 3–12)
NEUTROS PCT: 50 % (ref 43–77)
Neutro Abs: 3.1 10*3/uL (ref 1.7–7.7)
Platelets: 179 10*3/uL (ref 150–400)
RBC: 4.96 MIL/uL (ref 4.22–5.81)
RDW: 13 % (ref 11.5–15.5)
WBC: 6.1 10*3/uL (ref 4.0–10.5)

## 2015-02-03 LAB — SURGICAL PCR SCREEN
MRSA, PCR: NEGATIVE
Staphylococcus aureus: NEGATIVE

## 2015-02-03 NOTE — Progress Notes (Signed)
   02/03/15 1153  OBSTRUCTIVE SLEEP APNEA  Have you ever been diagnosed with sleep apnea through a sleep study? Yes (1980s)  If yes, do you have and use a CPAP or BPAP machine every night? 1  Do you know the presssure settings on your maching? Yes (has a SIM card)

## 2015-02-03 NOTE — Progress Notes (Signed)
Followed by Dr Nehemiah Settle @[facility]

## 2015-02-03 NOTE — Pre-Procedure Instructions (Signed)
Marcus Rojas  02/03/2015   Your procedure is scheduled on:  Monday, April 18 @ 8:00 am  Report to [facility] Admitting at 0600 AM.  Call this number if you have problems the morning of surgery: [phone]   Remember:   Do not eat food or drink liquids after midnight.Sunday night   Take these medicines the morning of surgery with A SIP OF WATER: diltiazem, hydrocodone for pain,   Do not wear jewelry.  Do not wear lotions, powders, or perfumes.Do not  wear deodorant.  Do not shave 48 hours prior to surgery. Men may shave face and neck.  Do not bring valuables to the hospital.  [facility] is not responsible    for any belongings or valuables.               Contacts, dentures or bridgework may not be worn into surgery.  Leave suitcase in the car. After surgery it may be brought to your room.  For patients admitted to the hospital, discharge time is determined by your     treatment team.      Special Instructions: Shower with CHG the night before surgery and the morning of surgery. Follow the "Preparing for surgery" fact sheet.   Please read over the following fact sheets that you were given: Pain Booklet, Coughing and Deep Breathing and Surgical Site Infection Prevention

## 2015-02-06 NOTE — Progress Notes (Signed)
Anesthesia Chart Review:  Patient is a 69 year old male scheduled for L3-4, L4-5, L5-S1 laminectomy on 02/13/15 by Dr. Annette Stable.  History includes non-smoker, SSS (4.2 second pause) s/p St. Jude single chamber PPM ([place]) 11/11/11, afib/PAF (diagnosed '12 prior to prostate surgery; SVT, pauses, but no afib on 08/2011 event monitor but now chronic afib by EP notes), CAD (not specified), HTN, HLD, OSA with CPAP use, PAD, SOB, prostate cancer s/p radiation therapy, cholecystectomy. BMI is consistent with obesity.  PCP is listed as Dr. London Pepper.  OSA followed by Dr. Nehemiah Settle.  Cardiologist is Dr. Candee Furbish, who signed a note of cardiac clearance on 3/221/6. EP cardiologist is Dr. Crissie Sickles.   Meds include ASA (on hold), Cartia XT, Lasix, Norco, KCl, fish oil, Zocor, Flomax. He has been on Xarelto in the past for afib. He has been reluctant to restart as more recently he was being treated with ASA alone (09/2011 event monitor showed no afib), and notified Dr. Kingsley Plan office that he would reconsider resuming after his back surgery.   02/03/15 EKG: A-paced rhythm. Baseline rhythm appears regular, although I don't see any distinct p-waves. This appears similar to his 07/09/13 EKG.   07/22/12 Echo: LA enlargement, LVEF 50-55%, pacemaker lead visualized in RA cavity and RV. Mildly enlarged RA and RV.   He had a normal stress echo on 10/01/11.  Pre-operative labs noted.   If no acute changes then I anticipate he can proceed as planned.   George Hugh [facility] Short Stay Center/Anesthesiology Phone [phone] 02/06/2015 5:57 PM

## 2015-02-07 DIAGNOSIS — N401 Enlarged prostate with lower urinary tract symptoms: Secondary | ICD-10-CM | POA: Diagnosis not present

## 2015-02-07 DIAGNOSIS — N138 Other obstructive and reflux uropathy: Secondary | ICD-10-CM | POA: Diagnosis not present

## 2015-02-07 DIAGNOSIS — Z8546 Personal history of malignant neoplasm of prostate: Secondary | ICD-10-CM | POA: Diagnosis not present

## 2015-02-10 NOTE — Progress Notes (Signed)
Call to [facility], then call was connected to device clinic, spoke with Pamala Hurry, refaxed device order sheet- different fax no. rec'd.

## 2015-02-12 MED ORDER — DEXAMETHASONE SODIUM PHOSPHATE 10 MG/ML IJ SOLN
10.0000 mg | INTRAMUSCULAR | Status: AC
  Administered 2015-02-13: 10 mg via INTRAVENOUS
  Filled 2015-02-12: qty 1

## 2015-02-12 MED ORDER — DEXTROSE 5 % IV SOLN
3.0000 g | INTRAVENOUS | Status: AC
  Administered 2015-02-13: 3 g via INTRAVENOUS
  Filled 2015-02-12: qty 3000

## 2015-02-13 ENCOUNTER — Encounter (HOSPITAL_COMMUNITY)
Admission: RE | Disposition: A | Discharge status: Home / Self Care | Payer: Self-pay | Source: Ambulatory Visit | Attending: Neurosurgery

## 2015-02-13 ENCOUNTER — Inpatient Hospital Stay (HOSPITAL_COMMUNITY): Payer: Medicare Other

## 2015-02-13 ENCOUNTER — Inpatient Hospital Stay (HOSPITAL_COMMUNITY): Payer: Medicare Other | Admitting: Vascular Surgery

## 2015-02-13 ENCOUNTER — Encounter (HOSPITAL_COMMUNITY): Payer: Self-pay | Admitting: Surgery

## 2015-02-13 ENCOUNTER — Inpatient Hospital Stay (HOSPITAL_COMMUNITY): Payer: Medicare Other | Admitting: Certified Registered Nurse Anesthetist

## 2015-02-13 ENCOUNTER — Observation Stay (HOSPITAL_COMMUNITY)
Admission: RE | Admit: 2015-02-13 | Discharge: 2015-02-14 | Disposition: A | Discharge status: Home / Self Care | Payer: Medicare Other | Source: Ambulatory Visit | Attending: Neurosurgery | Admitting: Neurosurgery

## 2015-02-13 DIAGNOSIS — I10 Essential (primary) hypertension: Secondary | ICD-10-CM | POA: Insufficient documentation

## 2015-02-13 DIAGNOSIS — M48061 Spinal stenosis, lumbar region without neurogenic claudication: Secondary | ICD-10-CM

## 2015-02-13 DIAGNOSIS — Z6839 Body mass index (BMI) 39.0-39.9, adult: Secondary | ICD-10-CM | POA: Diagnosis not present

## 2015-02-13 DIAGNOSIS — Z8546 Personal history of malignant neoplasm of prostate: Secondary | ICD-10-CM | POA: Diagnosis not present

## 2015-02-13 DIAGNOSIS — E785 Hyperlipidemia, unspecified: Secondary | ICD-10-CM | POA: Diagnosis not present

## 2015-02-13 DIAGNOSIS — M1990 Unspecified osteoarthritis, unspecified site: Secondary | ICD-10-CM | POA: Insufficient documentation

## 2015-02-13 DIAGNOSIS — M549 Dorsalgia, unspecified: Secondary | ICD-10-CM | POA: Diagnosis present

## 2015-02-13 DIAGNOSIS — G4733 Obstructive sleep apnea (adult) (pediatric): Secondary | ICD-10-CM | POA: Insufficient documentation

## 2015-02-13 DIAGNOSIS — Z87442 Personal history of urinary calculi: Secondary | ICD-10-CM | POA: Diagnosis not present

## 2015-02-13 DIAGNOSIS — Z9049 Acquired absence of other specified parts of digestive tract: Secondary | ICD-10-CM | POA: Diagnosis not present

## 2015-02-13 DIAGNOSIS — M4806 Spinal stenosis, lumbar region: Secondary | ICD-10-CM | POA: Diagnosis not present

## 2015-02-13 DIAGNOSIS — E559 Vitamin D deficiency, unspecified: Secondary | ICD-10-CM | POA: Insufficient documentation

## 2015-02-13 DIAGNOSIS — R0602 Shortness of breath: Secondary | ICD-10-CM | POA: Diagnosis not present

## 2015-02-13 DIAGNOSIS — M48062 Spinal stenosis, lumbar region with neurogenic claudication: Secondary | ICD-10-CM | POA: Diagnosis present

## 2015-02-13 DIAGNOSIS — I739 Peripheral vascular disease, unspecified: Secondary | ICD-10-CM | POA: Diagnosis not present

## 2015-02-13 DIAGNOSIS — Z7982 Long term (current) use of aspirin: Secondary | ICD-10-CM | POA: Diagnosis not present

## 2015-02-13 DIAGNOSIS — Z9889 Other specified postprocedural states: Secondary | ICD-10-CM | POA: Diagnosis not present

## 2015-02-13 DIAGNOSIS — Z950 Presence of cardiac pacemaker: Secondary | ICD-10-CM | POA: Insufficient documentation

## 2015-02-13 DIAGNOSIS — I2510 Atherosclerotic heart disease of native coronary artery without angina pectoris: Secondary | ICD-10-CM | POA: Insufficient documentation

## 2015-02-13 HISTORY — PX: LUMBAR LAMINECTOMY/DECOMPRESSION MICRODISCECTOMY: SHX5026

## 2015-02-13 SURGERY — LUMBAR LAMINECTOMY/DECOMPRESSION MICRODISCECTOMY 3 LEVELS
Anesthesia: General | Site: Back

## 2015-02-13 MED ORDER — ONDANSETRON HCL 4 MG/2ML IJ SOLN
INTRAMUSCULAR | Status: AC
  Filled 2015-02-13: qty 2

## 2015-02-13 MED ORDER — GLYCOPYRROLATE 0.2 MG/ML IJ SOLN
INTRAMUSCULAR | Status: DC | PRN
  Administered 2015-02-13: .8 mg via INTRAVENOUS

## 2015-02-13 MED ORDER — NEOSTIGMINE METHYLSULFATE 10 MG/10ML IV SOLN
INTRAVENOUS | Status: DC | PRN
  Administered 2015-02-13: 5 mg via INTRAVENOUS

## 2015-02-13 MED ORDER — HYDROMORPHONE HCL 1 MG/ML IJ SOLN
0.2500 mg | INTRAMUSCULAR | Status: DC | PRN
  Administered 2015-02-13 (×2): 0.5 mg via INTRAVENOUS

## 2015-02-13 MED ORDER — PROPOFOL 10 MG/ML IV BOLUS
INTRAVENOUS | Status: AC
  Filled 2015-02-13: qty 20

## 2015-02-13 MED ORDER — ROCURONIUM BROMIDE 50 MG/5ML IV SOLN
INTRAVENOUS | Status: AC
  Filled 2015-02-13: qty 1

## 2015-02-13 MED ORDER — KETOROLAC TROMETHAMINE 30 MG/ML IJ SOLN
INTRAMUSCULAR | Status: DC | PRN
  Administered 2015-02-13: 30 mg via INTRAVENOUS

## 2015-02-13 MED ORDER — PHENOL 1.4 % MT LIQD: 1.0000 | OROMUCOSAL | Status: DC | PRN

## 2015-02-13 MED ORDER — FENTANYL CITRATE (PF) 250 MCG/5ML IJ SOLN
INTRAMUSCULAR | Status: AC
  Filled 2015-02-13: qty 5

## 2015-02-13 MED ORDER — ADULT MULTIVITAMIN W/MINERALS CH
1.0000 | ORAL_TABLET | Freq: Every day | ORAL | Status: DC
  Administered 2015-02-13: 1 via ORAL
  Filled 2015-02-13 (×2): qty 1

## 2015-02-13 MED ORDER — LACTATED RINGERS IV SOLN
INTRAVENOUS | Status: DC | PRN
  Administered 2015-02-13 (×2): via INTRAVENOUS

## 2015-02-13 MED ORDER — TAMSULOSIN HCL 0.4 MG PO CAPS
0.4000 mg | ORAL_CAPSULE | Freq: Every day | ORAL | Status: DC
  Administered 2015-02-13: 0.4 mg via ORAL
  Filled 2015-02-13 (×2): qty 1

## 2015-02-13 MED ORDER — OXYCODONE HCL 5 MG PO TABS: 5.0000 mg | ORAL_TABLET | Freq: Once | ORAL | Status: DC | PRN

## 2015-02-13 MED ORDER — SIMVASTATIN 10 MG PO TABS
10.0000 mg | ORAL_TABLET | Freq: Every day | ORAL | Status: DC
  Administered 2015-02-13: 10 mg via ORAL
  Filled 2015-02-13 (×2): qty 1

## 2015-02-13 MED ORDER — ONDANSETRON HCL 4 MG/2ML IJ SOLN
4.0000 mg | INTRAMUSCULAR | Status: DC | PRN
Start: 2015-02-13 — End: 2015-02-14

## 2015-02-13 MED ORDER — MENTHOL 3 MG MT LOZG: 1.0000 | LOZENGE | OROMUCOSAL | Status: DC | PRN

## 2015-02-13 MED ORDER — SODIUM CHLORIDE 0.9 % IJ SOLN: 3.0000 mL | INTRAMUSCULAR | Status: DC | PRN

## 2015-02-13 MED ORDER — THROMBIN 5000 UNITS EX SOLR
CUTANEOUS | Status: DC | PRN
  Administered 2015-02-13 (×2): 5000 [IU] via TOPICAL

## 2015-02-13 MED ORDER — OXYCODONE-ACETAMINOPHEN 5-325 MG PO TABS: 1.0000 | ORAL_TABLET | ORAL | Status: DC | PRN

## 2015-02-13 MED ORDER — BUPIVACAINE HCL (PF) 0.25 % IJ SOLN
INTRAMUSCULAR | Status: DC | PRN
  Administered 2015-02-13: 20 mL

## 2015-02-13 MED ORDER — HYDROMORPHONE HCL 1 MG/ML IJ SOLN: 0.5000 mg | INTRAMUSCULAR | Status: DC | PRN

## 2015-02-13 MED ORDER — FUROSEMIDE 40 MG PO TABS
40.0000 mg | ORAL_TABLET | Freq: Every day | ORAL | Status: DC
  Administered 2015-02-13: 40 mg via ORAL
  Filled 2015-02-13 (×2): qty 1

## 2015-02-13 MED ORDER — KETOROLAC TROMETHAMINE 30 MG/ML IJ SOLN
30.0000 mg | Freq: Four times a day (QID) | INTRAMUSCULAR | Status: DC
  Administered 2015-02-13 – 2015-02-14 (×2): 30 mg via INTRAVENOUS
  Filled 2015-02-13 (×5): qty 1

## 2015-02-13 MED ORDER — POTASSIUM CHLORIDE CRYS ER 20 MEQ PO TBCR
20.0000 meq | EXTENDED_RELEASE_TABLET | Freq: Every day | ORAL | Status: DC
  Administered 2015-02-13: 20 meq via ORAL
  Filled 2015-02-13 (×2): qty 1

## 2015-02-13 MED ORDER — DILTIAZEM HCL ER COATED BEADS 120 MG PO CP24
120.0000 mg | ORAL_CAPSULE | Freq: Every day | ORAL | Status: DC
  Filled 2015-02-13: qty 1

## 2015-02-13 MED ORDER — OXYCODONE HCL 5 MG/5ML PO SOLN: 5.0000 mg | Freq: Once | ORAL | Status: DC | PRN

## 2015-02-13 MED ORDER — VANCOMYCIN HCL 1000 MG IV SOLR
INTRAVENOUS | Status: AC
  Filled 2015-02-13: qty 1000

## 2015-02-13 MED ORDER — SODIUM CHLORIDE 0.9 % IJ SOLN
3.0000 mL | Freq: Two times a day (BID) | INTRAMUSCULAR | Status: DC
  Administered 2015-02-13: 3 mL via INTRAVENOUS

## 2015-02-13 MED ORDER — ACETAMINOPHEN 325 MG PO TABS: 650.0000 mg | ORAL_TABLET | ORAL | Status: DC | PRN

## 2015-02-13 MED ORDER — HYDROCODONE-ACETAMINOPHEN 5-325 MG PO TABS
1.0000 | ORAL_TABLET | ORAL | Status: DC | PRN
  Administered 2015-02-13 – 2015-02-14 (×2): 2 via ORAL
  Filled 2015-02-13 (×2): qty 2

## 2015-02-13 MED ORDER — ROCURONIUM BROMIDE 100 MG/10ML IV SOLN
INTRAVENOUS | Status: DC | PRN
  Administered 2015-02-13: 50 mg via INTRAVENOUS

## 2015-02-13 MED ORDER — LIDOCAINE HCL (CARDIAC) 20 MG/ML IV SOLN
INTRAVENOUS | Status: AC
  Filled 2015-02-13: qty 5

## 2015-02-13 MED ORDER — CYCLOBENZAPRINE HCL 10 MG PO TABS: 10.0000 mg | ORAL_TABLET | Freq: Three times a day (TID) | ORAL | Status: DC | PRN

## 2015-02-13 MED ORDER — SODIUM CHLORIDE 0.9 % IV SOLN: 250.0000 mL | INTRAVENOUS | Status: DC

## 2015-02-13 MED ORDER — PROMETHAZINE HCL 25 MG/ML IJ SOLN: 6.2500 mg | INTRAMUSCULAR | Status: DC | PRN

## 2015-02-13 MED ORDER — HEMOSTATIC AGENTS (NO CHARGE) OPTIME
TOPICAL | Status: DC | PRN
  Administered 2015-02-13: 1 via TOPICAL

## 2015-02-13 MED ORDER — SALINE SPRAY 0.65 % NA SOLN: 1.0000 | NASAL | Status: DC | PRN

## 2015-02-13 MED ORDER — HYDROMORPHONE HCL 1 MG/ML IJ SOLN
INTRAMUSCULAR | Status: AC
  Administered 2015-02-13: 0.5 mg via INTRAVENOUS
  Filled 2015-02-13: qty 1

## 2015-02-13 MED ORDER — ACETAMINOPHEN 650 MG RE SUPP: 650.0000 mg | RECTAL | Status: DC | PRN

## 2015-02-13 MED ORDER — CEFAZOLIN SODIUM 1-5 GM-% IV SOLN
1.0000 g | Freq: Three times a day (TID) | INTRAVENOUS | Status: AC
  Administered 2015-02-13 (×2): 1 g via INTRAVENOUS
  Filled 2015-02-13 (×2): qty 50

## 2015-02-13 MED ORDER — 0.9 % SODIUM CHLORIDE (POUR BTL) OPTIME
TOPICAL | Status: DC | PRN
  Administered 2015-02-13: 1000 mL

## 2015-02-13 MED ORDER — LIDOCAINE HCL (CARDIAC) 20 MG/ML IV SOLN
INTRAVENOUS | Status: DC | PRN
  Administered 2015-02-13: 80 mg via INTRAVENOUS

## 2015-02-13 MED ORDER — ASPIRIN 325 MG PO TABS
325.0000 mg | ORAL_TABLET | Freq: Every day | ORAL | Status: DC
  Administered 2015-02-13: 325 mg via ORAL
  Filled 2015-02-13 (×2): qty 1

## 2015-02-13 MED ORDER — NEOSTIGMINE METHYLSULFATE 10 MG/10ML IV SOLN
INTRAVENOUS | Status: AC
  Filled 2015-02-13: qty 3

## 2015-02-13 MED ORDER — ONDANSETRON HCL 4 MG/2ML IJ SOLN
INTRAMUSCULAR | Status: DC | PRN
  Administered 2015-02-13: 4 mg via INTRAVENOUS

## 2015-02-13 MED ORDER — FENTANYL CITRATE (PF) 100 MCG/2ML IJ SOLN
INTRAMUSCULAR | Status: DC | PRN
  Administered 2015-02-13: 50 ug via INTRAVENOUS
  Administered 2015-02-13 (×2): 100 ug via INTRAVENOUS

## 2015-02-13 MED ORDER — GLYCOPYRROLATE 0.2 MG/ML IJ SOLN
INTRAMUSCULAR | Status: AC
  Filled 2015-02-13: qty 4

## 2015-02-13 MED ORDER — PROPOFOL 10 MG/ML IV BOLUS
INTRAVENOUS | Status: DC | PRN
  Administered 2015-02-13: 200 mg via INTRAVENOUS

## 2015-02-13 MED ORDER — SODIUM CHLORIDE 0.9 % IR SOLN
Status: DC | PRN
  Administered 2015-02-13: 500 mL

## 2015-02-13 MED ORDER — MIDAZOLAM HCL 5 MG/5ML IJ SOLN
INTRAMUSCULAR | Status: DC | PRN
  Administered 2015-02-13: 2 mg via INTRAVENOUS

## 2015-02-13 MED ORDER — MIDAZOLAM HCL 2 MG/2ML IJ SOLN
INTRAMUSCULAR | Status: AC
  Filled 2015-02-13: qty 2

## 2015-02-13 SURGICAL SUPPLY — 52 items
BAG DECANTER FOR FLEXI CONT (MISCELLANEOUS) ×3 IMPLANT
BENZOIN TINCTURE PRP APPL 2/3 (GAUZE/BANDAGES/DRESSINGS) ×3 IMPLANT
BLADE CLIPPER SURG (BLADE) IMPLANT
BRUSH SCRUB EZ PLAIN DRY (MISCELLANEOUS) ×3 IMPLANT
BUR CUTTER 7.0 ROUND (BURR) ×3 IMPLANT
CANISTER SUCT 3000ML PPV (MISCELLANEOUS) ×3 IMPLANT
CLOSURE WOUND 1/2 X4 (GAUZE/BANDAGES/DRESSINGS) ×1
CONT SPEC 4OZ CLIKSEAL STRL BL (MISCELLANEOUS) ×3 IMPLANT
DECANTER SPIKE VIAL GLASS SM (MISCELLANEOUS) ×3 IMPLANT
DERMABOND ADHESIVE PROPEN (GAUZE/BANDAGES/DRESSINGS) ×2
DERMABOND ADVANCED .7 DNX6 (GAUZE/BANDAGES/DRESSINGS) ×1 IMPLANT
DRAPE LAPAROTOMY 100X72X124 (DRAPES) ×3 IMPLANT
DRAPE MICROSCOPE LEICA (MISCELLANEOUS) ×3 IMPLANT
DRAPE POUCH INSTRU U-SHP 10X18 (DRAPES) ×3 IMPLANT
DRAPE PROXIMA HALF (DRAPES) IMPLANT
DRAPE SURG 17X23 STRL (DRAPES) ×6 IMPLANT
DRSG OPSITE POSTOP 4X6 (GAUZE/BANDAGES/DRESSINGS) ×3 IMPLANT
DURAPREP 26ML APPLICATOR (WOUND CARE) ×3 IMPLANT
ELECT REM PT RETURN 9FT ADLT (ELECTROSURGICAL) ×3
ELECTRODE REM PT RTRN 9FT ADLT (ELECTROSURGICAL) ×1 IMPLANT
GAUZE SPONGE 4X4 12PLY STRL (GAUZE/BANDAGES/DRESSINGS) ×3 IMPLANT
GAUZE SPONGE 4X4 16PLY XRAY LF (GAUZE/BANDAGES/DRESSINGS) ×3 IMPLANT
GLOVE BIO SURGEON STRL SZ8 (GLOVE) ×3 IMPLANT
GLOVE BIO SURGEON STRL SZ8.5 (GLOVE) ×3 IMPLANT
GLOVE ECLIPSE 9.0 STRL (GLOVE) ×3 IMPLANT
GLOVE EXAM NITRILE LRG STRL (GLOVE) IMPLANT
GLOVE EXAM NITRILE MD LF STRL (GLOVE) IMPLANT
GLOVE EXAM NITRILE XL STR (GLOVE) IMPLANT
GLOVE EXAM NITRILE XS STR PU (GLOVE) IMPLANT
GOWN STRL REUS W/ TWL LRG LVL3 (GOWN DISPOSABLE) ×1 IMPLANT
GOWN STRL REUS W/ TWL XL LVL3 (GOWN DISPOSABLE) IMPLANT
GOWN STRL REUS W/TWL 2XL LVL3 (GOWN DISPOSABLE) ×6 IMPLANT
GOWN STRL REUS W/TWL LRG LVL3 (GOWN DISPOSABLE) ×2
GOWN STRL REUS W/TWL XL LVL3 (GOWN DISPOSABLE)
KIT BASIN OR (CUSTOM PROCEDURE TRAY) ×3 IMPLANT
KIT ROOM TURNOVER OR (KITS) ×3 IMPLANT
LIQUID BAND (GAUZE/BANDAGES/DRESSINGS) ×3 IMPLANT
NEEDLE HYPO 22GX1.5 SAFETY (NEEDLE) ×3 IMPLANT
NEEDLE SPNL 22GX3.5 QUINCKE BK (NEEDLE) ×3 IMPLANT
NS IRRIG 1000ML POUR BTL (IV SOLUTION) ×3 IMPLANT
PACK LAMINECTOMY NEURO (CUSTOM PROCEDURE TRAY) ×3 IMPLANT
PAD ARMBOARD 7.5X6 YLW CONV (MISCELLANEOUS) ×9 IMPLANT
RUBBERBAND STERILE (MISCELLANEOUS) ×6 IMPLANT
SPONGE SURGIFOAM ABS GEL SZ50 (HEMOSTASIS) ×3 IMPLANT
STRIP CLOSURE SKIN 1/2X4 (GAUZE/BANDAGES/DRESSINGS) ×2 IMPLANT
SUT VIC AB 2-0 CT1 18 (SUTURE) ×6 IMPLANT
SUT VIC AB 3-0 SH 8-18 (SUTURE) ×3 IMPLANT
SYR 20ML ECCENTRIC (SYRINGE) ×3 IMPLANT
TAPE STRIPS DRAPE STRL (GAUZE/BANDAGES/DRESSINGS) ×3 IMPLANT
TOWEL OR 17X24 6PK STRL BLUE (TOWEL DISPOSABLE) ×3 IMPLANT
TOWEL OR 17X26 10 PK STRL BLUE (TOWEL DISPOSABLE) ×3 IMPLANT
WATER STERILE IRR 1000ML POUR (IV SOLUTION) ×3 IMPLANT

## 2015-02-13 NOTE — Brief Op Note (Signed)
02/13/2015  9:29 AM  PATIENT:  Gerda Diss Thurmon  70 y.o. male  PRE-OPERATIVE DIAGNOSIS:  Stenosis  POST-OPERATIVE DIAGNOSIS:  Stenosis  PROCEDURE:  Procedure(s) with comments: Laminectomy - L3-L4 - L4-L5 - L5-S1 (N/A) - Laminectomy - L3-L4 - L4-L5 - L5-S1  SURGEON:  Surgeon(s) and Role:    * Earnie Larsson, MD - Primary    * Newman Pies, MD - Assisting  PHYSICIAN ASSISTANT:   ASSISTANTS:    ANESTHESIA:   general  EBL:  Total I/O In: 1000 [I.V.:1000] Out: 250 [Blood:250]  BLOOD ADMINISTERED:none  DRAINS: none   LOCAL MEDICATIONS USED:  MARCAINE     SPECIMEN:  No Specimen  DISPOSITION OF SPECIMEN:  N/A  COUNTS:  YES  TOURNIQUET:  * No tourniquets in log *  DICTATION: .Dragon Dictation  PLAN OF CARE: Admit to inpatient   PATIENT DISPOSITION:  PACU - hemodynamically stable.   Delay start of Pharmacological VTE agent (>24hrs) due to surgical blood loss or risk of bleeding: yes

## 2015-02-13 NOTE — Anesthesia Procedure Notes (Signed)
Procedure Name: Intubation Performed by: Gean Maidens Pre-anesthesia Checklist: Patient identified, Emergency Drugs available, Suction available, Timeout performed and Patient being monitored Patient Re-evaluated:Patient Re-evaluated prior to inductionOxygen Delivery Method: Circle system utilized Preoxygenation: Pre-oxygenation with 100% oxygen Intubation Type: IV induction Ventilation: Oral airway inserted - appropriate to patient size and Mask ventilation without difficulty Laryngoscope Size: Mac and 4 Grade View: Grade II Tube type: Oral Tube size: 7.5 mm Number of attempts: 1 Placement Confirmation: ETT inserted through vocal cords under direct vision,  breath sounds checked- equal and bilateral,  positive ETCO2 and CO2 detector Secured at: 23 cm Tube secured with: Tape Dental Injury: Teeth and Oropharynx as per pre-operative assessment

## 2015-02-13 NOTE — Anesthesia Postprocedure Evaluation (Signed)
  Anesthesia Post-op Note  Patient: Marcus Rojas  Procedure(s) Performed: Procedure(s) with comments: Laminectomy - L3-L4 - L4-L5 - L5-S1 (N/A) - Laminectomy - L3-L4 - L4-L5 - L5-S1  Patient Location: PACU  Anesthesia Type:General  Level of Consciousness: awake, alert  and sedated  Airway and Oxygen Therapy: Patient Spontanous Breathing  Post-op Pain: mild  Post-op Assessment: Post-op Vital signs reviewed  Post-op Vital Signs: stable  Last Vitals:  Filed Vitals:   02/13/15 1046  BP: 127/59  Pulse: 65  Temp:   Resp: 10    Complications: No apparent anesthesia complications

## 2015-02-13 NOTE — Anesthesia Preprocedure Evaluation (Signed)
Anesthesia Evaluation  Patient identified by MRN, date of birth, ID band Patient awake    Reviewed: Allergy & Precautions, NPO status , Patient's Chart, lab work & pertinent test results  Airway Mallampati: II  TM Distance: >3 FB Neck ROM: Full    Dental  (+) Teeth Intact   Pulmonary shortness of breath, sleep apnea ,  breath sounds clear to auscultation        Cardiovascular hypertension, + CAD and + Peripheral Vascular Disease + dysrhythmias + pacemaker Rhythm:Regular Rate:Bradycardia     Neuro/Psych    GI/Hepatic   Endo/Other  Morbid obesity  Renal/GU      Musculoskeletal  (+) Arthritis -,   Abdominal (+) + obese,   Peds  Hematology   Anesthesia Other Findings   Reproductive/Obstetrics                             Anesthesia Physical Anesthesia Plan  ASA: III  Anesthesia Plan: General   Post-op Pain Management:    Induction: Intravenous  Airway Management Planned: Oral ETT  Additional Equipment: Arterial line  Intra-op Plan:   Post-operative Plan: Extubation in OR  Informed Consent: I have reviewed the patients History and Physical, chart, labs and discussed the procedure including the risks, benefits and alternatives for the proposed anesthesia with the patient or authorized representative who has indicated his/her understanding and acceptance.   Dental advisory given  Plan Discussed with:   Anesthesia Plan Comments:         Anesthesia Quick Evaluation

## 2015-02-13 NOTE — Transfer of Care (Signed)
Immediate Anesthesia Transfer of Care Note  Patient: Marcus Rojas  Procedure(s) Performed: Procedure(s) with comments: Laminectomy - L3-L4 - L4-L5 - L5-S1 (N/A) - Laminectomy - L3-L4 - L4-L5 - L5-S1  Patient Location: PACU  Anesthesia Type:General  Level of Consciousness: awake, alert  and oriented  Airway & Oxygen Therapy: Patient Spontanous Breathing and Patient connected to nasal cannula oxygen  Post-op Assessment: Report given to RN and Post -op Vital signs reviewed and stable  Post vital signs: Reviewed and stable  Last Vitals:  Filed Vitals:   02/13/15 0608  BP: 152/55  Pulse: 64  Temp: 36.9 C  Resp: 20    Complications: No apparent anesthesia complications

## 2015-02-13 NOTE — Plan of Care (Signed)
Problem: Consults Goal: Diagnosis - Spinal Surgery Outcome: Completed/Met Date Met:  02/13/15 Lumbar Laminectomy (Complex)

## 2015-02-13 NOTE — H&P (Signed)
Marcus Rojas is an 69 y.o. male.   Chief Complaint: Back and bilateral leg pain HPI: 69 year old male with back and bilateral lower extremity pain consistent with neurogenic claudication and failing conservative management. 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 Patient presents now for decompressive surgery.  Past Medical History  Diagnosis Date  . Prostate cancer 2012  . Obesity   . PAF (paroxysmal atrial fibrillation) 2012    HOLTER (4.2 sec pause good rate CTL). ECHO 09/29/11 - EF 55%  . Cardiac pacemaker in situ 2013    St. Jude - Bradycardia  . Bradycardia     PPM St. Jude insertion in 2013  . Hypertension   . DDD (degenerative disc disease)     lumbar  . SSS (sick sinus syndrome)   . Dyspnea   . Hyperlipidemia   . Edema 01/20/13  . Nocturia   . Joint pain   . Back pain   . Weakness   . Dizziness   . Vitamin D deficiency   . Coronary arteriosclerosis   . Osteoarthritis   . SOB (shortness of breath) on exertion 01/20/13  . S/P radiation therapy 10/2011    for prostate cancer  . Presence of permanent cardiac pacemaker 10/2011    [facility]  model # [id number] Serial # [id number]  . OSA (obstructive sleep apnea)     Wears CPAP ; monitored by Dr Nehemiah Settle  . Dysrhythmia   . Constipation due to opioid therapy   . History of kidney stones   . Poor peripheral circulation   . Baker's cyst of knee   . Seasonal allergies     Past Surgical History  Procedure Laterality Date  . Pacemaker insertion  11/11/11    St. Jude - Bradycardia  . Cholecystectomy  03/16/12  . Lithotripsy Right 2006  . Repair of laceration Left     ankle    Family History  Problem Relation Age of Onset  . Aneurysm Mother   . Heart disease Mother   . CAD Mother   . Bradycardia Mother   . Heart failure Mother   . Emphysema Father   . Cancer Father     lung  . COPD Father   . Obesity Sister   . Diabetes Sister   . Aneurysm Sister   . Diabetes Brother   .  Heart disease Maternal Grandmother   . Bradycardia Maternal Grandmother    Social History:  reports that he has never smoked. He does not have any smokeless tobacco history on file. He reports that he does not drink alcohol or use illicit drugs.  Allergies: No Known Allergies  Medications Prior to Admission  Medication Sig Dispense Refill  . acetaminophen (TYLENOL) 500 MG tablet Take 1,000 mg by mouth every 8 (eight) hours as needed.    Marland Kitchen aspirin 325 MG tablet Take 325 mg by mouth daily.    Marland Kitchen CARTIA XT 120 MG 24 hr capsule TAKE ONE CAPSULE BY MOUTH ONCE DAILY 30 capsule 0  . Cyanocobalamin (VITAMIN B-12 PO) Take 2,500 Units by mouth daily.     . furosemide (LASIX) 40 MG tablet TAKE ONE TABLET BY MOUTH ONCE DAILY 30 tablet 0  . Glucosamine-Chondroitin 1500-1200 MG/30ML LIQD Take 2 tablets by mouth daily.     Marland Kitchen HYDROcodone-acetaminophen (NORCO/VICODIN) 5-325 MG per tablet Take 1 tablet by mouth every 4 (four) hours as needed for moderate pain or severe pain.    Marland Kitchen KLOR-CON M20 20 MEQ tablet  TAKE ONE TABLET BY MOUTH ONCE DAILY 30 tablet 0  . Misc Natural Products (TURMERIC CURCUMIN) CAPS Take 2 capsules by mouth 2 (two) times daily.     . Multiple Vitamins-Minerals (MULTIVITAMIN WITH MINERALS) tablet Take 1 tablet by mouth daily.    . OIL OF OREGANO PO Take 5-8 drops by mouth daily.     . Omega-3 Fatty Acids (OMEGA-3 FISH OIL PO) Take 3 tablets by mouth daily    . simvastatin (ZOCOR) 10 MG tablet Take 1 tablet by mouth daily at 6 PM.     . Skin Protectants, Misc. (EUCERIN) cream Apply 1 application topically as needed for dry skin.    . sodium chloride (OCEAN) 0.65 % SOLN nasal spray Place 1 spray into both nostrils as needed for congestion.    . tamsulosin (FLOMAX) 0.4 MG CAPS capsule Take 0.4 mg by mouth daily.    Marland Kitchen VITAMIN D, CHOLECALCIFEROL, PO Take 1,000 Units by mouth daily.     . diazepam (VALIUM) 5 MG tablet Take 1 tablet (5 mg total) by mouth 2 (two) times daily. (Patient not taking:  Reported on 01/04/2015) 10 tablet 0  . HYDROcodone-acetaminophen (NORCO/VICODIN) 5-325 MG per tablet Take 1 tablet by mouth every 4 (four) hours as needed for severe pain. (Patient not taking: Reported on 01/30/2015) 15 tablet 0    No results found for this or any previous visit (from the past 48 hour(s)). No results found.  Review of Systems  Constitutional: Negative.   HENT: Negative.   Eyes: Negative.   Respiratory: Negative.   Cardiovascular: Negative.   Gastrointestinal: Negative.   Genitourinary: Negative.   Musculoskeletal: Negative.   Skin: Negative.   Neurological: Negative.   Endo/Heme/Allergies: Negative.   Psychiatric/Behavioral: Negative.     Blood pressure 152/55, pulse 64, temperature 98.4 F (36.9 C), temperature source Oral, resp. rate 20, height 6\' 2"  (1.88 m), weight 138.347 kg (305 lb), SpO2 100 %. Physical Exam  Constitutional: He is oriented to person, place, and time. He appears well-developed and well-nourished. No distress.  HENT:  Head: Normocephalic and atraumatic.  Right Ear: External ear normal.  Left Ear: External ear normal.  Nose: Nose normal.  Mouth/Throat: Oropharynx is clear and moist.  Eyes: Conjunctivae and EOM are normal. Pupils are equal, round, and reactive to light. Right eye exhibits no discharge. Left eye exhibits no discharge.  Neck: Normal range of motion. Neck supple. No tracheal deviation present. No thyromegaly present.  Cardiovascular: Normal rate, normal heart sounds and intact distal pulses.  Exam reveals no friction rub.   No murmur heard. Respiratory: Effort normal and breath sounds normal. No respiratory distress. He has no wheezes.  GI: Soft. Bowel sounds are normal. He exhibits no distension. There is no tenderness.  Musculoskeletal: Normal range of motion. He exhibits no edema or tenderness.  Neurological: He is alert and oriented to person, place, and time. He has normal reflexes. He displays normal reflexes. No cranial  nerve deficit. He exhibits normal muscle tone. Coordination normal.  Skin: Skin is warm and dry. No rash noted. He is not diaphoretic. No erythema. No pallor.  Psychiatric: He has a normal mood and affect. His behavior is normal. Judgment and thought content normal.     Assessment/Plan Lumbar stenosis with neurogenic claudication. Plan L3-S1 decompressive laminectomy with foraminotomies. Risks and benefits have been explained. Patient wishes to proceed.  Jenalee Trevizo A 02/13/2015, 7:37 AM

## 2015-02-13 NOTE — Op Note (Signed)
Date of procedure: 02/13/2015  Date of dictation: Same  Service: Neurosurgery  Preoperative diagnosis: L3-4, L4-5, L5-S1 stenosis with neurogenic claudication  Postoperative diagnosis: Same  Procedure Name: L3-4, L4-5, L5-S1 decompressive laminectomy with bilateral L3, L4, L5, S1 decompressive foraminotomies.  Surgeon:Nell Gales A.Eliab Closson, M.D.  Asst. Surgeon: Arnoldo Morale  Anesthesia: General  Indication: 69 year old male with back pain and bilateral lower extremity symptoms consistent with neurogenic claudication which have failed conservative management. Workup demonstrates evidence of significant spondylosis and stenosis at L3-4, L4-5, L5-S1. Patient presents now for decompressive surgery.  Operative note: After induction of anesthesia, patient position prone onto Wilson frame and appropriately padded. Lumbar region prepped and draped. Incision made from L3-S1. Retractor placed. X-ray taken. Levels confirmed. Decompressive laminectomy then performed using Leksell rongeurs Kerrison rongeurs the high-speed drill to remove the inferior two thirds of the lamina of L3 the entire lamina of L4 entire lamina of L5 and the superior aspect lamina of S1. Ligament flavum elevated and resected so fashion using Kerrison rongeurs. Partial medial facetectomies and then performed at L3-4 L4-5 and L5-S1. Decompressive foraminotomies were performed by undercutting the gutters and identified the exiting L3, L4, L5 and S1 nerve roots. Decompression of all nerve roots were achieved without difficulty. There was no evidence of injury to the thecal sac or nerve roots. This point a very thorough decompression had been achieved. Wound was then irrigated MI solution. Gelfoam was placed topically for hemostasis. Vancomycin powder was placed in the deep wound space. Wounds and closed in layers with Vicryl sutures. Steri-Strips sterile dressing were applied. No apparent complications. The patient tolerated the procedure well and he  returns to the recovery room postop.

## 2015-02-14 ENCOUNTER — Ambulatory Visit (INDEPENDENT_AMBULATORY_CARE_PROVIDER_SITE_OTHER): Payer: Medicare Other | Admitting: *Deleted

## 2015-02-14 DIAGNOSIS — I482 Chronic atrial fibrillation, unspecified: Secondary | ICD-10-CM

## 2015-02-14 DIAGNOSIS — M4806 Spinal stenosis, lumbar region: Secondary | ICD-10-CM | POA: Diagnosis not present

## 2015-02-14 LAB — MDC_IDC_ENUM_SESS_TYPE_REMOTE
Battery Remaining Longevity: 104 mo
Battery Voltage: 2.96 V
Brady Statistic RV Percent Paced: 82 %
Implantable Pulse Generator Model: 1210
Implantable Pulse Generator Serial Number: 7287976
Lead Channel Impedance Value: 540 Ohm
Lead Channel Pacing Threshold Pulse Width: 0.4 ms
Lead Channel Sensing Intrinsic Amplitude: 11.1 mV
Lead Channel Setting Pacing Pulse Width: 0.4 ms
Lead Channel Setting Sensing Sensitivity: 0.5 mV
MDC IDC MSMT BATTERY REMAINING PERCENTAGE: 79 %
MDC IDC MSMT LEADCHNL RV PACING THRESHOLD AMPLITUDE: 1 V
MDC IDC SESS DTM: 20160419163725
MDC IDC SET LEADCHNL RV PACING AMPLITUDE: 2.5 V

## 2015-02-14 MED ORDER — CYCLOBENZAPRINE HCL 10 MG PO TABS: 10.0000 mg | ORAL_TABLET | Freq: Three times a day (TID) | ORAL | Status: DC | PRN

## 2015-02-14 MED ORDER — HYDROCODONE-ACETAMINOPHEN 5-325 MG PO TABS: 1.0000 | ORAL_TABLET | ORAL | Status: DC | PRN

## 2015-02-14 NOTE — Discharge Instructions (Signed)
Laminectomy - Laminotomy - Discectomy Your surgeon has decided that a laminectomy (entire lamina removal) or laminotomy (partial lamina removal) is the best treatment for your back problem. These procedures involve removal of bone to relieve pressure on nerve roots. It allows the surgeon access to parts of the spine where other problems are located. This could be an injured disc (the cartilage-like structures located between the bones of the back). In this surgery your surgeon removes a part of the boney arch that surrounds your spinal canal. This may be compressing nerve roots. In some cases, the surgeon will remove the disc and fuse (stick together) vertebral bodies (the bones of your back) to make the spine more stable. The type of procedure you will need is usually decided prior to surgery, however modifications may be necessary. The time in surgery depends on the findings in surgery and the procedure necessary to correct the problems. DISCECTOMY For people with disc problems, the surgeon removes the portion of the disc that is causing the pressure on the nerve root. Some surgeons perform a micro (small) discectomy, which may require removal of only a small portion of the lamina. A disc nucleus (center) may also be removed either through a needle (percutaneous discectomy) or by injecting an enzyme called chymopapain into the disc. Chymopapain is an enzyme that dissolves the disc. For people with back instability, the surgeon fuses vertebrae that are next to each other with tiny pieces of bone. These are used as bone grafts on the facets, or between the vertebrae. When this heals, the bones will no longer be able to move. These bone chips are often taken from the pelvic bones. Bones and bone grafts grow into one unit, stabilizing the segments of the spinal column. LET YOUR CAREGIVER KNOW ABOUT:  Allergies.  Medicines taken including herbs, eyedrops, over-the-counter medicines, and creams.  Use of  steroids (by mouth or creams).  Previous problems with anesthetics or numbing medicine.  Possibility of pregnancy, if this applies.  History of blood clots (thrombophlebitis).  History of bleeding or blood problems.  Previous surgery.  Other health problems. RISKS AND COMPLICATIONS Your caregiver will discuss possible risks and complications with you before surgery. In addition to the usual risks of anesthesia, other common risks and complications include:  Blood loss and replacement.  Temporary increase in pain due to surgery.  Uncorrected back pain.  Infection.  New nerve damage (tingling, numbness, and pain). BEFORE THE PROCEDURE  Stop smoking at least 1 week prior to surgery. This lowers risk during surgery.  Your caregiver may advise that you stop taking certain medicines that may affect the outcome of the surgery and your ability to heal. For example, you may need to stop taking anti-inflammatories, such as aspirin, because of possible bleeding problems. Other medicines may have interactions with anesthesia.  Tell your caregiver if you have been on steroids for long periods of time. Often, additional steroids are administered intravenously before and during the procedure to prevent complications.  You should be present 60 minutes prior to your procedure or as directed. AFTER THE PROCEDURE After surgery, you will be taken to the recovery area where a nurse will watch and check your progress. Generally, you will be allowed to go home within 1 week barring other problems. HOME CARE INSTRUCTIONS   Check the surgical cut (incision) twice a day for signs of infection. Some signs may include a bad smelling, greenish or yellowish discharge from the wound; increased pain or increased redness over the  incision site; an opening of the incision; flu-like symptoms; or a temperature above 101.5 F (38.6 C).  Change your bandages in about 24 to 36 hours following surgery or as  directed.  You may shower once the bandage is removed or as directed. Avoid bathtubs, swimming pools, and hot tubs for 3 weeks or until your incision has healed completely. If you have stitches (sutures) or staples they may be removed 2 to 3 weeks after surgery, or as directed by your caregiver.  Follow your caregiver's instructions for activities, exercises, and physical therapy.  Weight reduction may be beneficial if you are overweight.  Walking is permitted. You may use a treadmill without an incline. Cut down on activities if you have discomfort. You may also go up and down stairs as tolerated.  Do not lift anything heavier than 10 to 15 pounds. Avoid bending or twisting at the waist. Always bend your knees.  Maintain strength and range of motion as instructed.  No driving is permitted for 2 to 3 weeks, or as directed by your caregiver. You may be a passenger for 20 to 30 minute trips. Lying back in the passenger seat may be more comfortable for you.  Limit your sitting to 20 to 30 minute intervals. You should lie down or walk in between sitting periods. There are no limitations for sitting in a recliner chair.  Only take over-the-counter or prescription medicines for pain, discomfort, or fever as directed by your caregiver. SEEK MEDICAL CARE IF:   There is increased bleeding (more than a small spot) from the wound.  You notice redness, swelling, or increasing pain in the wound.  Pus is coming from the wound.  An unexplained oral temperature above 102 F (38.9 C) develops.  You notice a bad smell coming from the wound or dressing. SEEK IMMEDIATE MEDICAL CARE IF:   You develop a rash.  You have difficulty breathing.  You have any allergic problems. Document Released: 10/11/2000 Document Revised: 02/28/2014 Document Reviewed: 08/09/2008 [facility] Patient Information 2015 [place], [place]. This information is not intended to replace advice given to you by your health care  provider. Make sure you discuss any questions you have with your health care provider. Wound Care Keep incision covered and dry for two days.  If you shower, cover incision with plastic wrap.  Do not put any creams, lotions, or ointments on incision. Leave steri-strips on back.  They will fall off by themselves. Activity Walk each and every day, increasing distance each day. No lifting greater than 5 lbs.  Avoid excessive neck motion. No driving for 2 weeks; may ride as a passenger locally. If provided with back brace, wear when out of bed.  It is not necessary to wear brace in bed. Diet Resume your normal diet.  Return to Work Will be discussed at you follow up appointment. Call Your Doctor If Any of These Occur Redness, drainage, or swelling at the wound.  Temperature greater than 101 degrees. Severe pain not relieved by pain medication. Incision starts to come apart. Follow Up Appt Call today for appointment in 1-2 weeks ([phone]) or for problems.  If you have any hardware placed in your spine, you will need an x-ray before your appointment.

## 2015-02-14 NOTE — Progress Notes (Signed)
Remote pacemaker transmission.   

## 2015-02-14 NOTE — Discharge Summary (Signed)
Physician Discharge Summary  Patient ID: Marcus Rojas MRN: [id number] DOB/AGE: [birth year] 69 y.o.  Admit date: 02/13/2015 Discharge date: 02/14/2015  Admission Diagnoses:  Discharge Diagnoses:  Principal Problem:   Spinal stenosis, lumbar region, with neurogenic claudication Active Problems:   Spinal stenosis of lumbar region with neurogenic claudication   Discharged Condition: good  Hospital Course: The patient was admitted to the hospital where he underwent an uncomplicated multilevel lumbar decompressive laminectomy. Postoperatively he is doing very well. Back and leg pain much improved. Standing and walking without difficulty. Ready for discharge home.  Consults:   Significant Diagnostic Studies:   Treatments:   Discharge Exam: Blood pressure 110/53, pulse 60, temperature 98.6 F (37 C), temperature source Oral, resp. rate 16, height 6\' 2"  (1.88 m), weight 138.347 kg (305 lb), SpO2 98 %. Awake and alert. Oriented and appropriate. Motor and sensory function intact. Wound clean and dry. Chest and abdomen benign.  Disposition: 01-Home or Self Care     Medication List    TAKE these medications        acetaminophen 500 MG tablet  Commonly known as:  TYLENOL  Take 1,000 mg by mouth every 8 (eight) hours as needed.     aspirin 325 MG tablet  Take 325 mg by mouth daily.     CARTIA XT 120 MG 24 hr capsule  Generic drug:  diltiazem  TAKE ONE CAPSULE BY MOUTH ONCE DAILY     cyclobenzaprine 10 MG tablet  Commonly known as:  FLEXERIL  Take 1 tablet (10 mg total) by mouth 3 (three) times daily as needed for muscle spasms.     diazepam 5 MG tablet  Commonly known as:  VALIUM  Take 1 tablet (5 mg total) by mouth 2 (two) times daily.     eucerin cream  Apply 1 application topically as needed for dry skin.     furosemide 40 MG tablet  Commonly known as:  LASIX  TAKE ONE TABLET BY MOUTH ONCE DAILY     Glucosamine-Chondroitin 1500-1200 MG/30ML Liqd  Take 2 tablets  by mouth daily.     HYDROcodone-acetaminophen 5-325 MG per tablet  Commonly known as:  NORCO/VICODIN  Take 1 tablet by mouth every 4 (four) hours as needed for severe pain.     HYDROcodone-acetaminophen 5-325 MG per tablet  Commonly known as:  NORCO/VICODIN  Take 1-2 tablets by mouth every 4 (four) hours as needed for moderate pain or severe pain.     KLOR-CON M20 20 MEQ tablet  Generic drug:  potassium chloride SA  TAKE ONE TABLET BY MOUTH ONCE DAILY     multivitamin with minerals tablet  Take 1 tablet by mouth daily.     OIL OF OREGANO PO  Take 5-8 drops by mouth daily.     OMEGA-3 FISH OIL PO  Take 3 tablets by mouth daily     simvastatin 10 MG tablet  Commonly known as:  ZOCOR  Take 1 tablet by mouth daily at 6 PM.     sodium chloride 0.65 % Soln nasal spray  Commonly known as:  OCEAN  Place 1 spray into both nostrils as needed for congestion.     tamsulosin 0.4 MG Caps capsule  Commonly known as:  FLOMAX  Take 0.4 mg by mouth daily.     Turmeric Curcumin Caps  Take 2 capsules by mouth 2 (two) times daily.     VITAMIN B-12 PO  Take 2,500 Units by mouth daily.     VITAMIN  D (CHOLECALCIFEROL) PO  Take 1,000 Units by mouth daily.           Follow-up Information    Follow up with Charlie Pitter, MD.   Specialty:  Neurosurgery   Contact information:   [address] [place] [address] [phone]       Signed: Charlie Pitter 02/14/2015, 9:44 AM

## 2015-02-14 NOTE — Progress Notes (Signed)
Pt doing well. Pt given D/C instructions with Rx's, verbal understanding was provided. Pt's incision is covered with Honeycomb dressing and has no sign of infection. Pt's IV was removed prior to D/C. Pt D/C'd home via wheelchair @ 1045 per MD order. Pt is stable @ D/C and has no other needs at this time. Holli Humbles, RN

## 2015-02-14 NOTE — Progress Notes (Signed)
UR completed 

## 2015-02-15 ENCOUNTER — Encounter (HOSPITAL_COMMUNITY): Payer: Self-pay | Admitting: Neurosurgery

## 2015-02-20 ENCOUNTER — Encounter: Payer: Self-pay | Admitting: Cardiology

## 2015-02-23 ENCOUNTER — Encounter: Payer: Self-pay | Admitting: Internal Medicine

## 2015-02-27 ENCOUNTER — Other Ambulatory Visit: Payer: Self-pay

## 2015-02-27 MED ORDER — DILTIAZEM HCL ER COATED BEADS 120 MG PO CP24: 120.0000 mg | ORAL_CAPSULE | Freq: Every day | ORAL | Status: DC

## 2015-03-01 DIAGNOSIS — G4733 Obstructive sleep apnea (adult) (pediatric): Secondary | ICD-10-CM | POA: Diagnosis not present

## 2015-03-01 DIAGNOSIS — J310 Chronic rhinitis: Secondary | ICD-10-CM | POA: Diagnosis not present

## 2015-03-15 ENCOUNTER — Ambulatory Visit: Payer: Medicare Other | Admitting: Cardiology

## 2015-03-17 ENCOUNTER — Encounter: Payer: Self-pay | Admitting: Cardiology

## 2015-03-17 ENCOUNTER — Other Ambulatory Visit: Payer: Self-pay | Admitting: *Deleted

## 2015-03-17 ENCOUNTER — Ambulatory Visit (INDEPENDENT_AMBULATORY_CARE_PROVIDER_SITE_OTHER): Payer: Medicare Other | Admitting: Cardiology

## 2015-03-17 VITALS — BP 132/72 | HR 64 | Ht 74.0 in | Wt 301.0 lb

## 2015-03-17 DIAGNOSIS — I480 Paroxysmal atrial fibrillation: Secondary | ICD-10-CM

## 2015-03-17 DIAGNOSIS — E669 Obesity, unspecified: Secondary | ICD-10-CM | POA: Diagnosis not present

## 2015-03-17 DIAGNOSIS — Z950 Presence of cardiac pacemaker: Secondary | ICD-10-CM

## 2015-03-17 NOTE — Progress Notes (Signed)
[place]. [address], [address], [place]  [address] Phone: [phone] Fax:  [phone]  Date:  03/17/2015   ID:  Marcus Rojas, DOB 08/22/1946, MRN [id number]  PCP:  London Pepper, MD   History of Present Illness: Marcus Rojas is a 69 y.o. male with paroxysmal atrial fibrillation in 2012 discovered prior to prostate surgery, CHADS-VASc 1, 4.2 second pause on Holter monitor bleeding to pacemaker, single lead St. Jude placement and 2013 here for followup. Had back surgery. Chronic dyspnea but this has improved quite a bit with exercise.  He has been staying with his in-laws.  Echocardiogram demonstrated normal ejection fraction with only mild valvular abnormalities. Obesity.      Wt Readings from Last 3 Encounters:  03/17/15 301 lb (136.533 kg)  02/13/15 305 lb (138.347 kg)  11/15/14 306 lb 3.2 oz (138.891 kg)     Past Medical History  Diagnosis Date  . Prostate cancer 2012  . Obesity   . PAF (paroxysmal atrial fibrillation) 2012    HOLTER (4.2 sec pause good rate CTL). ECHO 09/29/11 - EF 55%  . Cardiac pacemaker in situ 2013    St. Jude - Bradycardia  . Bradycardia     PPM St. Jude insertion in 2013  . Hypertension   . DDD (degenerative disc disease)     lumbar  . SSS (sick sinus syndrome)   . Dyspnea   . Hyperlipidemia   . Edema 01/20/13  . Nocturia   . Joint pain   . Back pain   . Weakness   . Dizziness   . Vitamin D deficiency   . Coronary arteriosclerosis   . Osteoarthritis   . SOB (shortness of breath) on exertion 01/20/13  . S/P radiation therapy 10/2011    for prostate cancer  . Presence of permanent cardiac pacemaker 10/2011    [facility]  model # [id number] Serial # [id number]  . OSA (obstructive sleep apnea)     Wears CPAP ; monitored by Dr Nehemiah Settle  . Dysrhythmia   . Constipation due to opioid therapy   . History of kidney stones   . Poor peripheral circulation   . Baker's cyst of knee   . Seasonal allergies     Past Surgical  History  Procedure Laterality Date  . Pacemaker insertion  11/11/11    St. Jude - Bradycardia  . Cholecystectomy  03/16/12  . Lithotripsy Right 2006  . Repair of laceration Left     ankle  . Lumbar laminectomy/decompression microdiscectomy N/A 02/13/2015    Procedure: Laminectomy - L3-L4 - L4-L5 - L5-S1;  Surgeon: Earnie Larsson, MD;  Location: [facility];  Service: Neurosurgery;  Laterality: N/A;  Laminectomy - L3-L4 - L4-L5 - L5-S1    Current Outpatient Prescriptions  Medication Sig Dispense Refill  . acetaminophen (TYLENOL) 500 MG tablet Take 1,000 mg by mouth every 8 (eight) hours as needed.    Marland Kitchen aspirin 325 MG tablet Take 325 mg by mouth daily.    . clobetasol ointment (TEMOVATE) 0.05 %     . Cyanocobalamin (VITAMIN B-12 PO) Take 2,500 Units by mouth daily.     . diazepam (VALIUM) 5 MG tablet Take 1 tablet (5 mg total) by mouth 2 (two) times daily. 10 tablet 0  . diltiazem (CARTIA XT) 120 MG 24 hr capsule Take 1 capsule (120 mg total) by mouth daily. 30 capsule 0  . furosemide (LASIX) 40 MG tablet TAKE ONE TABLET BY  MOUTH ONCE DAILY 30 tablet 0  . Glucosamine-Chondroitin 1500-1200 MG/30ML LIQD Take 2 tablets by mouth daily.     Marland Kitchen HYDROcodone-acetaminophen (NORCO/VICODIN) 5-325 MG per tablet Take 1 tablet by mouth every 6 (six) hours as needed for moderate pain.    Marland Kitchen KLOR-CON M20 20 MEQ tablet TAKE ONE TABLET BY MOUTH ONCE DAILY 30 tablet 0  . Misc Natural Products (TURMERIC CURCUMIN) CAPS Take 2 capsules by mouth 2 (two) times daily.     . Multiple Vitamins-Minerals (MULTIVITAMIN WITH MINERALS) tablet Take 1 tablet by mouth daily.    . OIL OF OREGANO PO Take 5-8 drops by mouth daily.     . Omega-3 Fatty Acids (OMEGA-3 FISH OIL PO) Take 3 tablets by mouth daily    . simvastatin (ZOCOR) 10 MG tablet Take 1 tablet by mouth daily at 6 PM.     . Skin Protectants, Misc. (EUCERIN) cream Apply 1 application topically as needed for dry skin.    . tamsulosin (FLOMAX) 0.4 MG CAPS capsule Take 0.4  mg by mouth daily.    Marland Kitchen VITAMIN D, CHOLECALCIFEROL, PO Take 1,000 Units by mouth daily.      No current facility-administered medications for this visit.    Allergies:   No Known Allergies  Social History:  The patient  reports that he has never smoked. He does not have any smokeless tobacco history on file. He reports that he does not drink alcohol or use illicit drugs.   ROS:  Please see the history of present illness.   Occasionally will feel some left chest cramping when using a noodle in the swimming pole.  PHYSICAL EXAM: VS:  BP 132/72 mmHg  Pulse 64  Ht 6\' 2"  (1.88 m)  Wt 301 lb (136.533 kg)  BMI 38.63 kg/m2 Well nourished, well developed, in no acute distress HEENT: normal Neck: no JVD Cardiac:  normal S1, S2; RRR; no murmur Lungs:  clear to auscultation bilaterally, no wheezing, rhonchi or rales Abd: soft, nontender, no hepatomegalyObese Ext: no edema Skin: warm and dry Neuro: no focal abnormalities noted  EKG:  None today  ASSESSMENT AND PLAN:  1. Atrial fibrillation-previously to monitor showed no evidence of atrial fibrillation. Pacemaker will continue to monitor this. Well controlled.CHADS-VASc 1 (Age only). We had lengthy discussion about anticoagulation and since he does not technically have hypertension, we will forego. He is also very worried about cost. Xarelto was quite expensive for him and he is on a fixed income. I explained to him that once 31 age occurs, we will need to place on anticoagulation. 2. Pacemaker-doing well. 8 years of battery. 3. Obesity-doing a great job with weight loss. 4. One year follow  Signed, Candee Furbish, MD [facility]  03/17/2015 2:59 PM

## 2015-03-17 NOTE — Patient Instructions (Signed)
Medication Instructions:  Your physician recommends that you continue on your current medications as directed. Please refer to the Current Medication list given to you today.  Follow-Up: Follow up in 1 year with Dr. Skains.  You will receive a letter in the mail 2 months before you are due.  Please call us when you receive this letter to schedule your follow up appointment.  Thank you for choosing [facility]!!       

## 2015-03-24 ENCOUNTER — Other Ambulatory Visit: Payer: Self-pay | Admitting: Cardiology

## 2015-04-11 DIAGNOSIS — L570 Actinic keratosis: Secondary | ICD-10-CM | POA: Diagnosis not present

## 2015-05-16 ENCOUNTER — Encounter: Payer: Self-pay | Admitting: Internal Medicine

## 2015-05-16 ENCOUNTER — Ambulatory Visit (INDEPENDENT_AMBULATORY_CARE_PROVIDER_SITE_OTHER): Payer: Medicare Other | Admitting: *Deleted

## 2015-05-16 DIAGNOSIS — I482 Chronic atrial fibrillation, unspecified: Secondary | ICD-10-CM

## 2015-05-16 NOTE — Progress Notes (Signed)
Remote pacemaker transmission.   

## 2015-05-17 LAB — CUP PACEART REMOTE DEVICE CHECK
Battery Remaining Percentage: 95.5 %
Brady Statistic RV Percent Paced: 81 %
Lead Channel Pacing Threshold Amplitude: 1 V
Lead Channel Sensing Intrinsic Amplitude: 12 mV
MDC IDC MSMT BATTERY REMAINING LONGEVITY: 128 mo
MDC IDC MSMT BATTERY VOLTAGE: 2.96 V
MDC IDC MSMT LEADCHNL RV IMPEDANCE VALUE: 540 Ohm
MDC IDC MSMT LEADCHNL RV PACING THRESHOLD PULSEWIDTH: 0.4 ms
MDC IDC SESS DTM: 20160719060008
MDC IDC SET LEADCHNL RV PACING AMPLITUDE: 2.5 V
MDC IDC SET LEADCHNL RV PACING PULSEWIDTH: 0.4 ms
MDC IDC SET LEADCHNL RV SENSING SENSITIVITY: 0.5 mV
Pulse Gen Model: 1210
Pulse Gen Serial Number: 7287976

## 2015-05-18 DIAGNOSIS — R030 Elevated blood-pressure reading, without diagnosis of hypertension: Secondary | ICD-10-CM | POA: Diagnosis not present

## 2015-05-18 DIAGNOSIS — Z6839 Body mass index (BMI) 39.0-39.9, adult: Secondary | ICD-10-CM | POA: Diagnosis not present

## 2015-05-18 DIAGNOSIS — M4806 Spinal stenosis, lumbar region: Secondary | ICD-10-CM | POA: Diagnosis not present

## 2015-06-02 ENCOUNTER — Encounter: Payer: Self-pay | Admitting: Cardiology

## 2015-06-09 DIAGNOSIS — M4806 Spinal stenosis, lumbar region: Secondary | ICD-10-CM | POA: Diagnosis not present

## 2015-06-13 DIAGNOSIS — M4806 Spinal stenosis, lumbar region: Secondary | ICD-10-CM | POA: Diagnosis not present

## 2015-06-15 DIAGNOSIS — M4806 Spinal stenosis, lumbar region: Secondary | ICD-10-CM | POA: Diagnosis not present

## 2015-06-20 DIAGNOSIS — M4806 Spinal stenosis, lumbar region: Secondary | ICD-10-CM | POA: Diagnosis not present

## 2015-06-21 ENCOUNTER — Other Ambulatory Visit: Payer: Self-pay | Admitting: Family Medicine

## 2015-06-21 ENCOUNTER — Ambulatory Visit
Admission: RE | Admit: 2015-06-21 | Discharge: 2015-06-21 | Disposition: A | Discharge status: Home / Self Care | Payer: Medicare Other | Source: Ambulatory Visit | Attending: Family Medicine | Admitting: Family Medicine

## 2015-06-21 DIAGNOSIS — H9319 Tinnitus, unspecified ear: Secondary | ICD-10-CM | POA: Diagnosis not present

## 2015-06-21 DIAGNOSIS — Z0000 Encounter for general adult medical examination without abnormal findings: Secondary | ICD-10-CM | POA: Diagnosis not present

## 2015-06-21 DIAGNOSIS — R0602 Shortness of breath: Secondary | ICD-10-CM | POA: Diagnosis not present

## 2015-06-21 DIAGNOSIS — E559 Vitamin D deficiency, unspecified: Secondary | ICD-10-CM | POA: Diagnosis not present

## 2015-06-21 DIAGNOSIS — R5383 Other fatigue: Secondary | ICD-10-CM | POA: Diagnosis not present

## 2015-06-21 DIAGNOSIS — G4733 Obstructive sleep apnea (adult) (pediatric): Secondary | ICD-10-CM | POA: Diagnosis not present

## 2015-06-21 DIAGNOSIS — I10 Essential (primary) hypertension: Secondary | ICD-10-CM | POA: Diagnosis not present

## 2015-06-21 DIAGNOSIS — Z23 Encounter for immunization: Secondary | ICD-10-CM | POA: Diagnosis not present

## 2015-06-21 DIAGNOSIS — E785 Hyperlipidemia, unspecified: Secondary | ICD-10-CM | POA: Diagnosis not present

## 2015-06-22 DIAGNOSIS — M4806 Spinal stenosis, lumbar region: Secondary | ICD-10-CM | POA: Diagnosis not present

## 2015-06-27 DIAGNOSIS — M4806 Spinal stenosis, lumbar region: Secondary | ICD-10-CM | POA: Diagnosis not present

## 2015-06-29 DIAGNOSIS — M4806 Spinal stenosis, lumbar region: Secondary | ICD-10-CM | POA: Diagnosis not present

## 2015-07-04 DIAGNOSIS — M4806 Spinal stenosis, lumbar region: Secondary | ICD-10-CM | POA: Diagnosis not present

## 2015-07-06 DIAGNOSIS — H903 Sensorineural hearing loss, bilateral: Secondary | ICD-10-CM | POA: Diagnosis not present

## 2015-07-06 DIAGNOSIS — M4806 Spinal stenosis, lumbar region: Secondary | ICD-10-CM | POA: Diagnosis not present

## 2015-07-06 DIAGNOSIS — H9313 Tinnitus, bilateral: Secondary | ICD-10-CM | POA: Diagnosis not present

## 2015-07-11 DIAGNOSIS — M4806 Spinal stenosis, lumbar region: Secondary | ICD-10-CM | POA: Diagnosis not present

## 2015-07-13 DIAGNOSIS — M4806 Spinal stenosis, lumbar region: Secondary | ICD-10-CM | POA: Diagnosis not present

## 2015-07-18 DIAGNOSIS — M4806 Spinal stenosis, lumbar region: Secondary | ICD-10-CM | POA: Diagnosis not present

## 2015-07-20 DIAGNOSIS — M4806 Spinal stenosis, lumbar region: Secondary | ICD-10-CM | POA: Diagnosis not present

## 2015-07-26 DIAGNOSIS — M4806 Spinal stenosis, lumbar region: Secondary | ICD-10-CM | POA: Diagnosis not present

## 2015-08-15 ENCOUNTER — Ambulatory Visit (INDEPENDENT_AMBULATORY_CARE_PROVIDER_SITE_OTHER): Payer: Medicare Other | Admitting: *Deleted

## 2015-08-15 ENCOUNTER — Encounter: Payer: Self-pay | Admitting: Cardiology

## 2015-08-15 DIAGNOSIS — I442 Atrioventricular block, complete: Secondary | ICD-10-CM | POA: Diagnosis not present

## 2015-08-15 LAB — CUP PACEART REMOTE DEVICE CHECK
Battery Remaining Longevity: 130 mo
Battery Voltage: 2.96 V
Brady Statistic RV Percent Paced: 83 %
Date Time Interrogation Session: 20161018073456
Implantable Lead Implant Date: 20130114
Lead Channel Impedance Value: 580 Ohm
Lead Channel Setting Pacing Amplitude: 2.5 V
Lead Channel Setting Sensing Sensitivity: 0.5 mV
MDC IDC LEAD LOCATION: 753860
MDC IDC MSMT BATTERY REMAINING PERCENTAGE: 95.5 %
MDC IDC MSMT LEADCHNL RV SENSING INTR AMPL: 12 mV
MDC IDC SET LEADCHNL RV PACING PULSEWIDTH: 0.4 ms
Pulse Gen Model: 1210
Pulse Gen Serial Number: 7287976

## 2015-08-15 NOTE — Progress Notes (Signed)
Remote pacemaker transmission.   

## 2015-09-14 ENCOUNTER — Telehealth: Payer: Self-pay | Admitting: Cardiology

## 2015-09-14 NOTE — Telephone Encounter (Signed)
Spoke w/ pt and informed pt that his home monitor is his to keep. When he moves and get a new cardiologist we will release his records to them and they'll be able to follow his home monitor. Pt verbalized understanding.

## 2015-10-03 DIAGNOSIS — J029 Acute pharyngitis, unspecified: Secondary | ICD-10-CM | POA: Diagnosis not present

## 2015-10-03 DIAGNOSIS — Z950 Presence of cardiac pacemaker: Secondary | ICD-10-CM | POA: Diagnosis not present

## 2015-10-03 DIAGNOSIS — I4891 Unspecified atrial fibrillation: Secondary | ICD-10-CM | POA: Diagnosis not present

## 2015-10-03 DIAGNOSIS — E785 Hyperlipidemia, unspecified: Secondary | ICD-10-CM | POA: Diagnosis not present

## 2015-10-03 DIAGNOSIS — B370 Candidal stomatitis: Secondary | ICD-10-CM | POA: Diagnosis not present

## 2015-10-03 DIAGNOSIS — I10 Essential (primary) hypertension: Secondary | ICD-10-CM | POA: Diagnosis not present

## 2015-11-29 ENCOUNTER — Encounter: Payer: Self-pay | Admitting: *Deleted

## 2015-12-15 IMAGING — CT CT L SPINE W/ CM
4 of 8 series · 14 of 34 positions shown, 16 images · non-contrast
Comparison: lumbar spine CT 06/30/2012 done in [REDACTED].

CLINICAL DATA: Clinical symptoms of spinal claudication. Low back
pain. Bilateral leg pain and weakness.
TECHNIQUE: Contiguous axial images were obtained through the Lumbar spine after
the intrathecal infusion of infusion. Coronal and sagittal
reconstructions were obtained of the axial image sets.

[Series 4: l spine bone · axial · 0.31mm/px · z∈[-42,+63]mm · 3 of 84 slices shown, 4 images]
[im 21/84  soft-tissue]
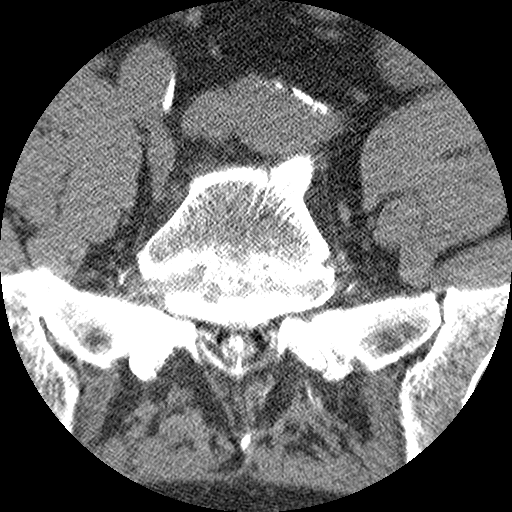
[im 21/84  bone]
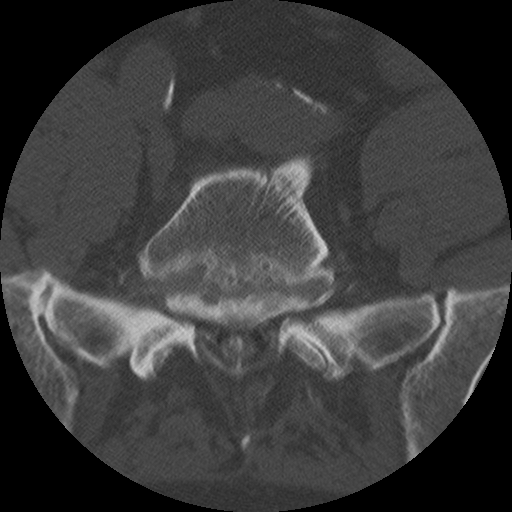
[im 42/84  bone]
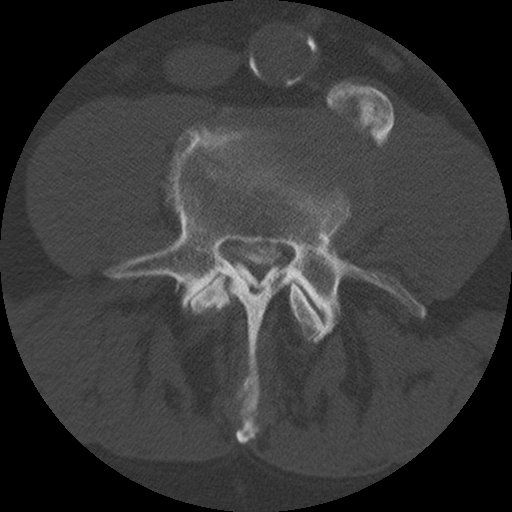
[im 63/84  bone]
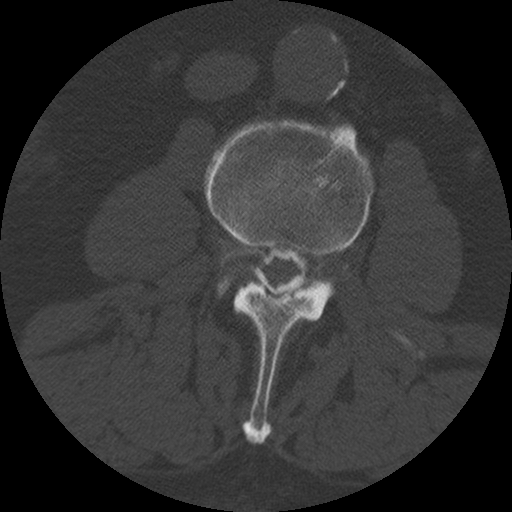

[Series 5: l-spine detail · axial · 0.31mm/px · z∈[-42,+63]mm · 3 of 84 slices shown]
[im 21/84  bone]
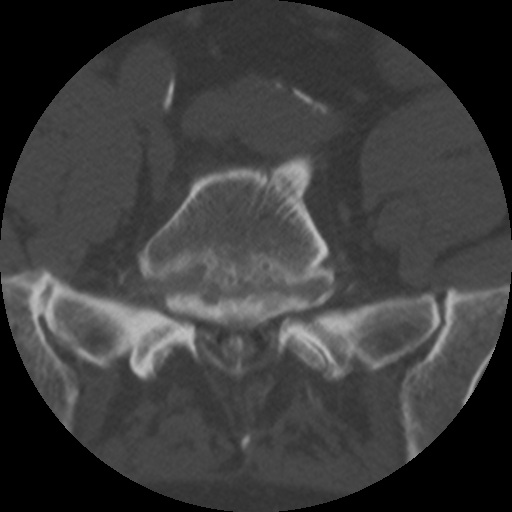
[im 42/84  bone]
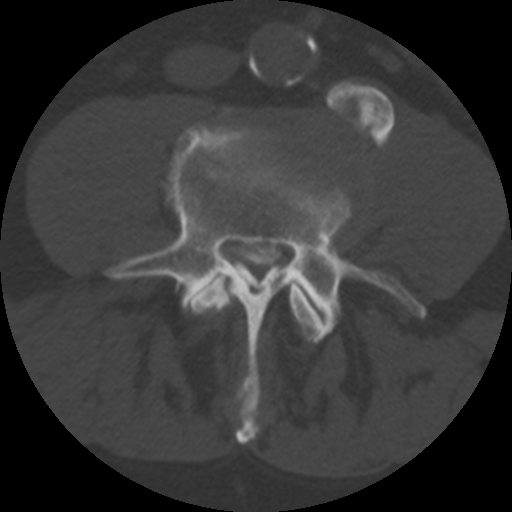
[im 63/84  bone]
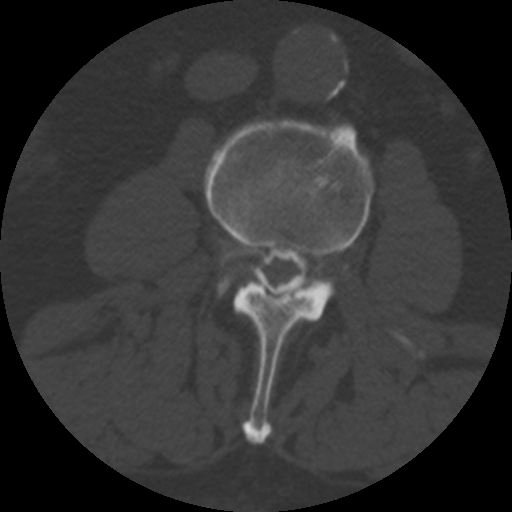

[Series 200: coronal · coronal · 0.42mm/px · 3 of 58 slices shown]
[im 12/58  bone]
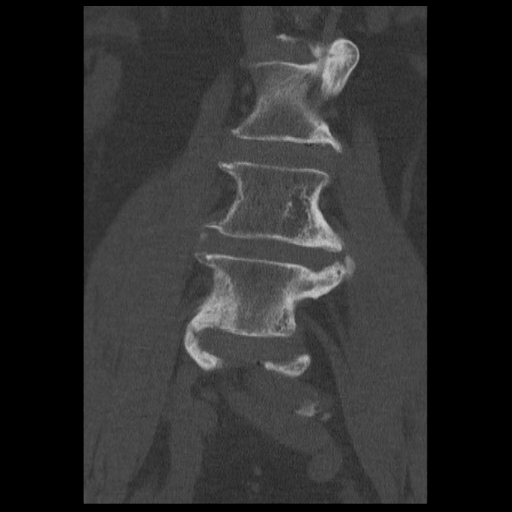
[im 23/58  bone]
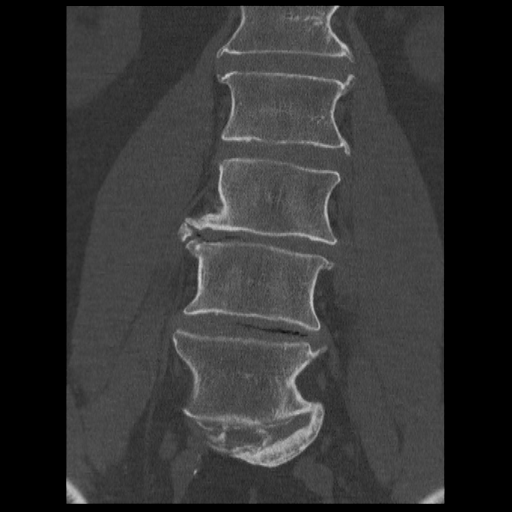
[im 35/58  bone]
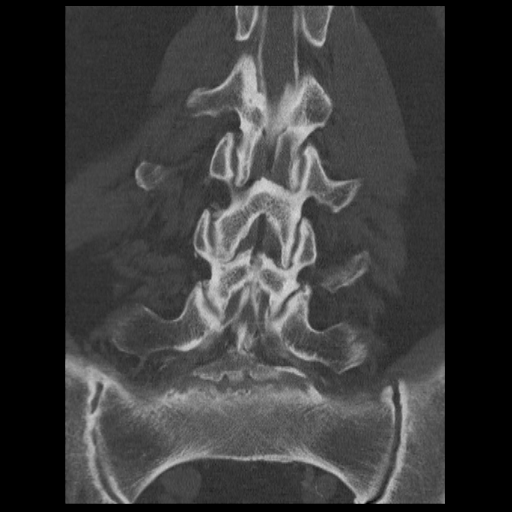

[Series 201: sagittal · sagittal · 0.42mm/px · 5 of 58 slices shown, 6 images]
[im 20/58  bone]
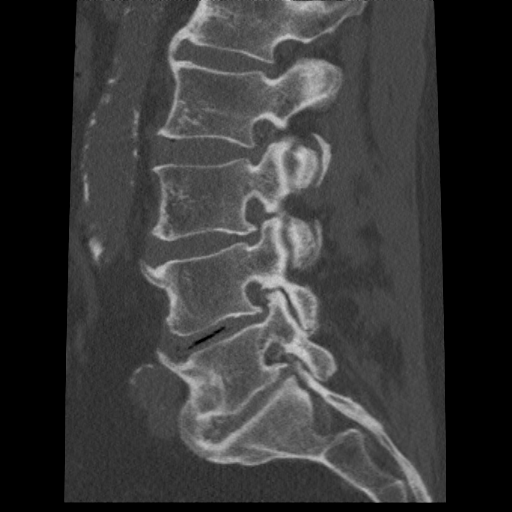
[im 24/58  bone]
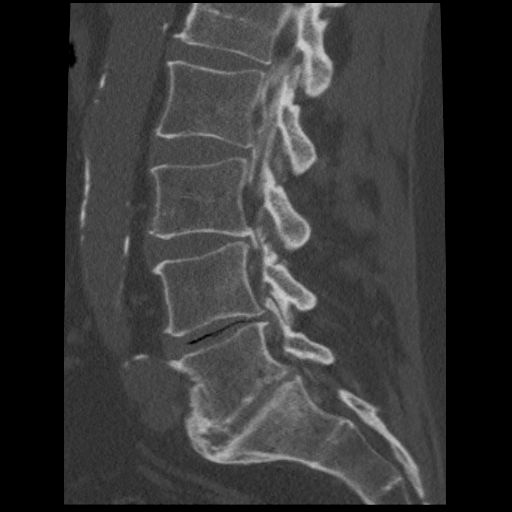
[im 29/58  soft-tissue]
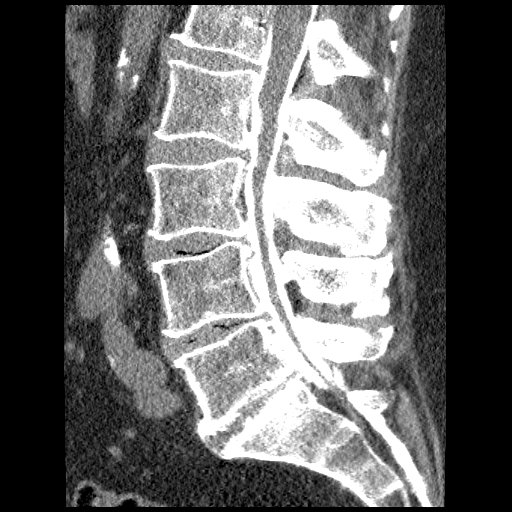
[im 29/58  bone]
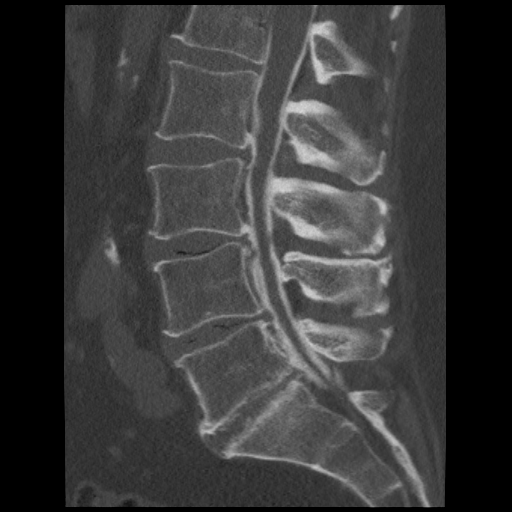
[im 34/58  bone]
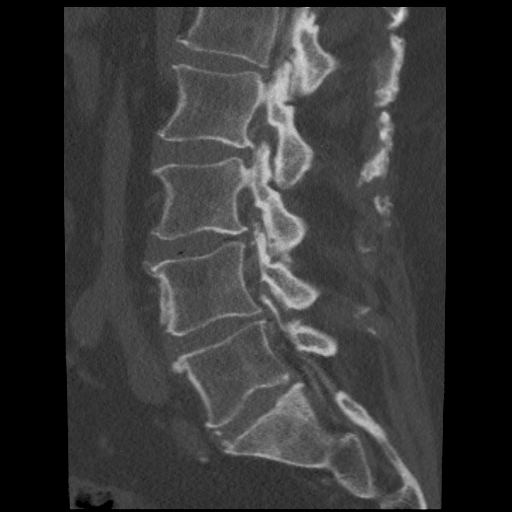
[im 39/58  bone]
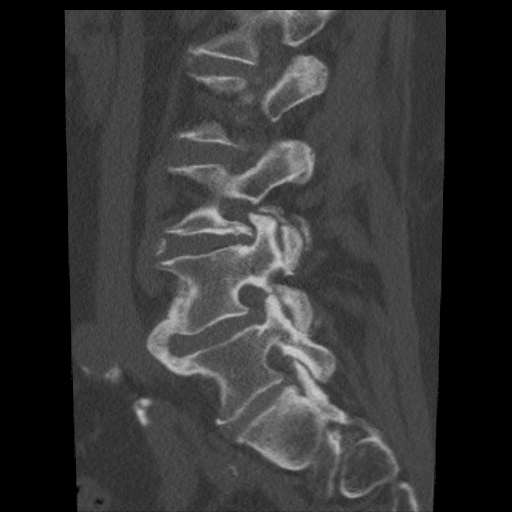

[14 of 34 positions shown; findings below may reference images not displayed]

EXAM:
LUMBAR MYELOGRAM

FLUOROSCOPY TIME:  1 minutes 36 seconds. Seventy-three deci Gy cm
squared

PROCEDURE:
After thorough discussion of risks and benefits of the procedure
including bleeding, infection, injury to nerves, blood vessels,
adjacent structures as well as headache and CSF leak, written and
oral informed consent was obtained. Consent was obtained by Dr. Jorge
Jintaro. Time out form was completed.

Patient was positioned prone on the fluoroscopy table. Local
anesthesia was provided with 1% lidocaine without epinephrine after
prepped and draped in the usual sterile fashion. Puncture was
performed at L5-S1 using a 5 inch 22-gauge spinal needle via left
para median approach. Using a single pass through the dura, the
needle was placed within the thecal sac, with return of clear CSF.
15 mL of Fmnipaque-YJG was injected. Initially, this appeared
intrathecal. Towards the end of the injection, at least some
subdural/epidural nature became evident.

I personally performed the lumbar puncture and administered the
intrathecal contrast. I also personally performed acquisition of the
myelogram images.
FINDINGS: LUMBAR MYELOGRAM FINDINGS:

Contrast injection initially looked normal. Towards the end of the
injection, it became evident that the injection was largely mixed.

Moderate multifactorial stenosis is seen at L3-4 and L4-5 with
potential for neural compression on either or both sides.

Standing lateral flexion extension views do not show any
subluxation.

CT LUMBAR MYELOGRAM FINDINGS:

The injection is largely subdural with a small epidural component.
Nonetheless, this presents useful information as follows.

L1-2: No disc pathology. No facet arthropathy. No stenosis. Solid
bridging osteophytes are present anteriorly.

L2-3: Chronic annular bulging with annular calcification, more
prominent in the right posterior lateral direction. Mild facet and
ligamentous hypertrophy. Mild stenosis of the right lateral recess
but without gross neural compression.

L3-4: Chronic annular bulging with annular calcification. Bilateral
facet and ligamentous hypertrophy. Moderate stenosis of both lateral
recesses. Mild foraminal stenosis bilaterally. Neural compression
could occur at this level. Solid bridging osteophytes are present
anteriorly.

L4-5: Disc degeneration with vacuum phenomenon. Moderate protrusion
of disc material. Bilateral facet and ligamentous hypertrophy.
Severe stenosis of both lateral recesses. Neural compression could
occur at this level. There is some caudal migration of nitrogen gas,
possibly accompanied by a small amount of disc material. Solid
bridging osteophytes anteriorly.

L5-S1: Loss of disc height. Mild bulging of the disc. Mild facet
degeneration and hypertrophy. No compressive canal or foraminal
stenosis. Solid bridging osteophytes anteriorly.

There is bilateral sacroiliac osteoarthritis, left worse than right.
Solid bridging osteophytes are present on the right.
IMPRESSION: The injection, which looked normal until the end of the volume, is
almost entirely subdural. Nonetheless, there is considerable
diagnostic utility.

L2-3: Chronic annular bulging and annular calcification more towards
the right. Mild stenosis of the right lateral recess without gross
neural compression.

L3-4: Chronic disc protrusion with annular calcification. Facet and
ligamentous hypertrophy. Severe stenosis of both lateral recesses.
Moderate stenosis of the foramina right more than left. Neural
compression could occur at this level. Solid bridging osteophytes
are present anteriorly.

L4-5: Chronic disc protrusion with slight caudal down turning. Facet
and ligamentous hypertrophy. Severe stenosis of both lateral
recesses. Mild stenosis of both neural foramina. Neural compression
could occur at this level. Solid bridging osteophytes are present
anteriorly.

L5-S1: Bulging of the disc. No compressive stenosis. Solid bridging
osteophytes anteriorly.

The pattern of annular calcification and solid bridging osteophytes
is consistent with the diagnosis of diffuse idiopathic skeletal
hyperostosis.

## 2015-12-19 DIAGNOSIS — M5136 Other intervertebral disc degeneration, lumbar region: Secondary | ICD-10-CM | POA: Diagnosis not present

## 2015-12-19 DIAGNOSIS — M2240 Chondromalacia patellae, unspecified knee: Secondary | ICD-10-CM | POA: Diagnosis not present

## 2015-12-19 DIAGNOSIS — I10 Essential (primary) hypertension: Secondary | ICD-10-CM | POA: Diagnosis not present

## 2015-12-19 DIAGNOSIS — Z923 Personal history of irradiation: Secondary | ICD-10-CM | POA: Diagnosis not present

## 2015-12-19 DIAGNOSIS — Z7689 Persons encountering health services in other specified circumstances: Secondary | ICD-10-CM | POA: Diagnosis not present

## 2015-12-19 DIAGNOSIS — N200 Calculus of kidney: Secondary | ICD-10-CM | POA: Diagnosis not present

## 2015-12-19 DIAGNOSIS — I4891 Unspecified atrial fibrillation: Secondary | ICD-10-CM | POA: Diagnosis not present

## 2015-12-19 DIAGNOSIS — E785 Hyperlipidemia, unspecified: Secondary | ICD-10-CM | POA: Diagnosis not present

## 2015-12-19 DIAGNOSIS — M7120 Synovial cyst of popliteal space [Baker], unspecified knee: Secondary | ICD-10-CM | POA: Diagnosis not present

## 2015-12-19 DIAGNOSIS — Z8546 Personal history of malignant neoplasm of prostate: Secondary | ICD-10-CM | POA: Diagnosis not present

## 2015-12-19 DIAGNOSIS — G4733 Obstructive sleep apnea (adult) (pediatric): Secondary | ICD-10-CM | POA: Diagnosis not present

## 2015-12-19 DIAGNOSIS — Z87438 Personal history of other diseases of male genital organs: Secondary | ICD-10-CM | POA: Diagnosis not present

## 2015-12-20 DIAGNOSIS — M2242 Chondromalacia patellae, left knee: Secondary | ICD-10-CM | POA: Diagnosis not present

## 2015-12-20 DIAGNOSIS — M7122 Synovial cyst of popliteal space [Baker], left knee: Secondary | ICD-10-CM | POA: Diagnosis not present

## 2015-12-20 DIAGNOSIS — Z136 Encounter for screening for cardiovascular disorders: Secondary | ICD-10-CM | POA: Diagnosis not present

## 2015-12-20 DIAGNOSIS — M1712 Unilateral primary osteoarthritis, left knee: Secondary | ICD-10-CM | POA: Diagnosis not present

## 2015-12-20 DIAGNOSIS — M2240 Chondromalacia patellae, unspecified knee: Secondary | ICD-10-CM | POA: Diagnosis not present

## 2015-12-20 DIAGNOSIS — R739 Hyperglycemia, unspecified: Secondary | ICD-10-CM | POA: Diagnosis not present

## 2015-12-20 DIAGNOSIS — M179 Osteoarthritis of knee, unspecified: Secondary | ICD-10-CM | POA: Diagnosis not present

## 2015-12-20 DIAGNOSIS — M25562 Pain in left knee: Secondary | ICD-10-CM | POA: Diagnosis not present

## 2015-12-20 DIAGNOSIS — E785 Hyperlipidemia, unspecified: Secondary | ICD-10-CM | POA: Diagnosis not present

## 2015-12-20 DIAGNOSIS — Z125 Encounter for screening for malignant neoplasm of prostate: Secondary | ICD-10-CM | POA: Diagnosis not present

## 2015-12-26 DIAGNOSIS — G4710 Hypersomnia, unspecified: Secondary | ICD-10-CM | POA: Diagnosis not present

## 2015-12-26 DIAGNOSIS — E669 Obesity, unspecified: Secondary | ICD-10-CM | POA: Diagnosis not present

## 2015-12-26 DIAGNOSIS — G4733 Obstructive sleep apnea (adult) (pediatric): Secondary | ICD-10-CM | POA: Diagnosis not present

## 2015-12-26 DIAGNOSIS — R0600 Dyspnea, unspecified: Secondary | ICD-10-CM | POA: Diagnosis not present

## 2015-12-27 ENCOUNTER — Encounter: Payer: Self-pay | Admitting: *Deleted

## 2015-12-27 DIAGNOSIS — M1712 Unilateral primary osteoarthritis, left knee: Secondary | ICD-10-CM | POA: Diagnosis not present

## 2015-12-29 DIAGNOSIS — Z45018 Encounter for adjustment and management of other part of cardiac pacemaker: Secondary | ICD-10-CM | POA: Diagnosis not present

## 2015-12-29 DIAGNOSIS — I495 Sick sinus syndrome: Secondary | ICD-10-CM | POA: Diagnosis not present

## 2015-12-29 DIAGNOSIS — R0602 Shortness of breath: Secondary | ICD-10-CM | POA: Diagnosis not present

## 2015-12-29 DIAGNOSIS — I4891 Unspecified atrial fibrillation: Secondary | ICD-10-CM | POA: Diagnosis not present

## 2016-01-01 DIAGNOSIS — R0600 Dyspnea, unspecified: Secondary | ICD-10-CM | POA: Diagnosis not present

## 2016-01-01 DIAGNOSIS — Z950 Presence of cardiac pacemaker: Secondary | ICD-10-CM | POA: Diagnosis not present

## 2016-01-02 ENCOUNTER — Ambulatory Visit (INDEPENDENT_AMBULATORY_CARE_PROVIDER_SITE_OTHER): Payer: Medicare Other | Admitting: *Deleted

## 2016-01-02 DIAGNOSIS — I442 Atrioventricular block, complete: Secondary | ICD-10-CM | POA: Diagnosis not present

## 2016-01-08 NOTE — Progress Notes (Signed)
Remote pacemaker transmission.   

## 2016-01-10 DIAGNOSIS — I361 Nonrheumatic tricuspid (valve) insufficiency: Secondary | ICD-10-CM | POA: Diagnosis not present

## 2016-01-10 DIAGNOSIS — R0602 Shortness of breath: Secondary | ICD-10-CM | POA: Diagnosis not present

## 2016-01-10 DIAGNOSIS — I340 Nonrheumatic mitral (valve) insufficiency: Secondary | ICD-10-CM | POA: Diagnosis not present

## 2016-01-10 DIAGNOSIS — R9430 Abnormal result of cardiovascular function study, unspecified: Secondary | ICD-10-CM | POA: Diagnosis not present

## 2016-01-11 LAB — CUP PACEART REMOTE DEVICE CHECK
Battery Remaining Longevity: 119 mo
Battery Remaining Percentage: 91 %
Battery Voltage: 2.95 V
Brady Statistic RV Percent Paced: 85 %
Date Time Interrogation Session: 20170307082624
Implantable Lead Implant Date: 20130114
Implantable Lead Location: 753860
Lead Channel Setting Pacing Amplitude: 2.5 V
Lead Channel Setting Pacing Pulse Width: 0.4 ms
Lead Channel Setting Sensing Sensitivity: 0.5 mV
MDC IDC MSMT LEADCHNL RV IMPEDANCE VALUE: 540 Ohm
MDC IDC MSMT LEADCHNL RV PACING THRESHOLD AMPLITUDE: 0.75 V
MDC IDC MSMT LEADCHNL RV PACING THRESHOLD PULSEWIDTH: 0.4 ms
MDC IDC MSMT LEADCHNL RV SENSING INTR AMPL: 12 mV
MDC IDC PG SERIAL: 7287976

## 2016-01-12 ENCOUNTER — Encounter: Payer: Self-pay | Admitting: Cardiology

## 2016-01-24 IMAGING — CR DG LUMBAR SPINE 1V
1 series · 1 of 1 positions shown · non-contrast
Comparison: CT 01/04/2015.

CLINICAL DATA: Lumbar spine surgery.

EXAM:
LUMBAR SPINE - 1 VIEW

[lat]
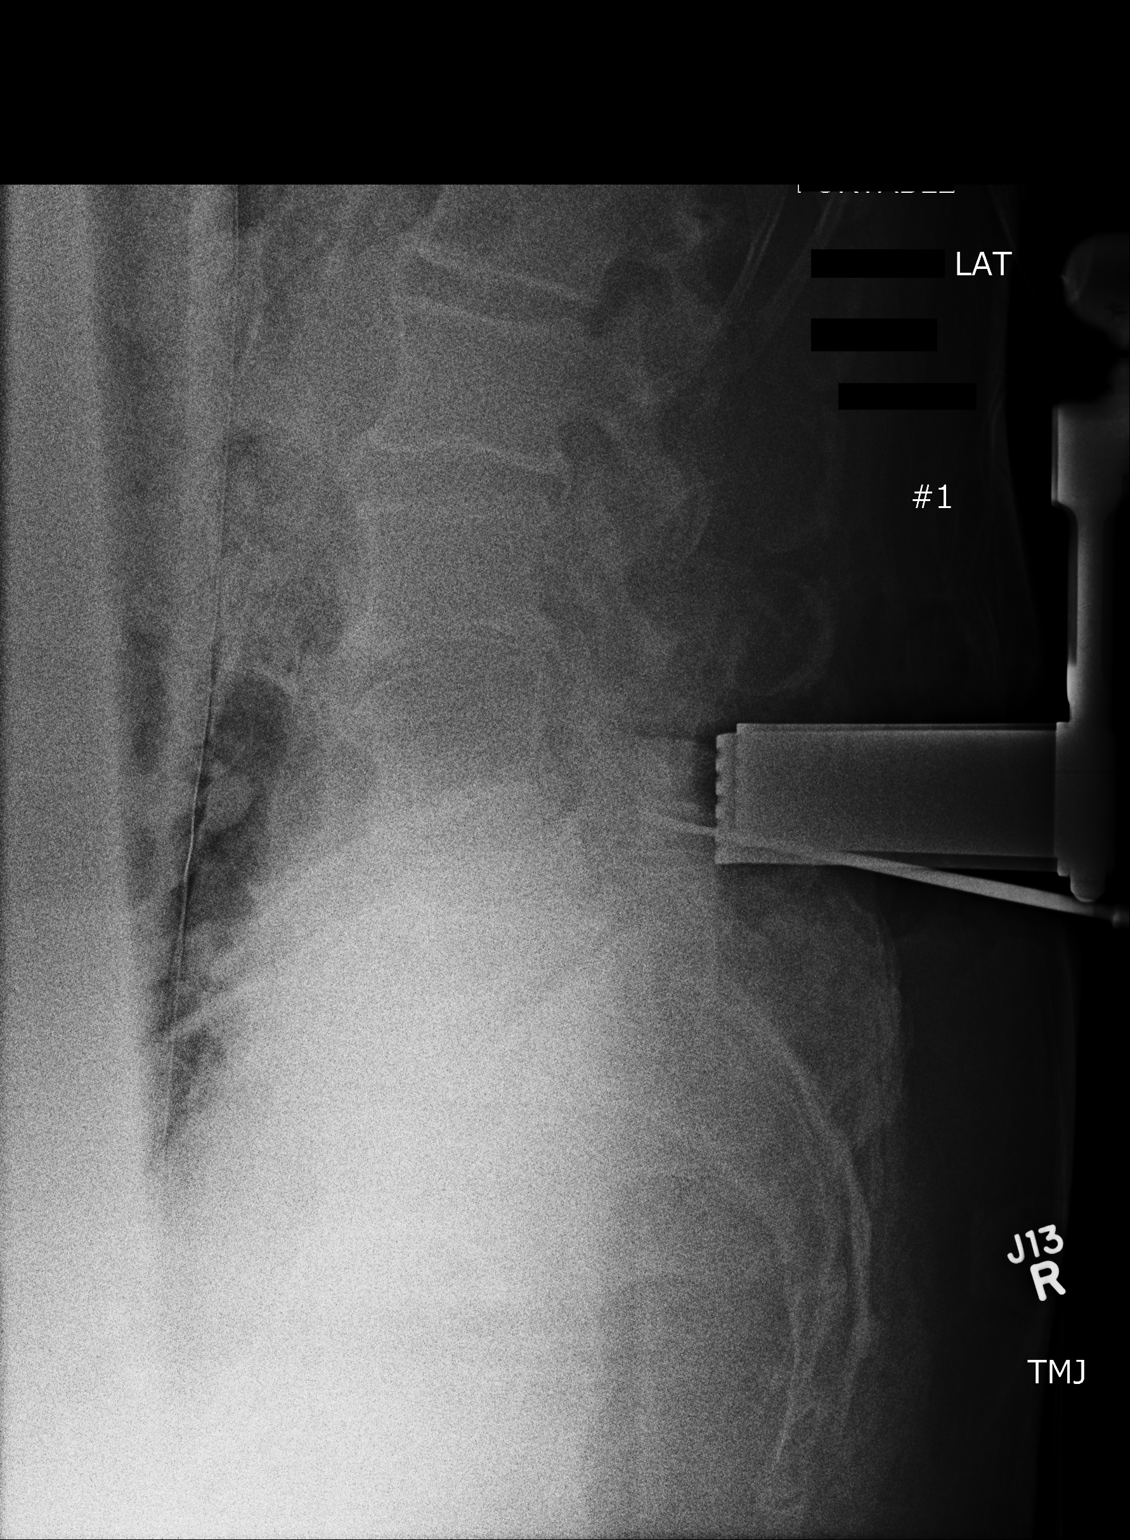

[1 of 1 positions shown; findings below may reference images not displayed]

FINDINGS: Lumbar per numbered as per prior CT. Metallic marker noted
posteriorly at the L4-L5 level.
IMPRESSION: Metallic marker noted posteriorly at the L4-L5 level.

## 2016-01-30 DIAGNOSIS — G4733 Obstructive sleep apnea (adult) (pediatric): Secondary | ICD-10-CM | POA: Diagnosis not present

## 2016-01-31 DIAGNOSIS — G4733 Obstructive sleep apnea (adult) (pediatric): Secondary | ICD-10-CM | POA: Diagnosis not present

## 2016-02-21 DIAGNOSIS — E669 Obesity, unspecified: Secondary | ICD-10-CM | POA: Diagnosis not present

## 2016-02-21 DIAGNOSIS — G4733 Obstructive sleep apnea (adult) (pediatric): Secondary | ICD-10-CM | POA: Diagnosis not present

## 2016-03-13 ENCOUNTER — Other Ambulatory Visit: Payer: Self-pay | Admitting: Cardiology

## 2016-04-05 DIAGNOSIS — Z4502 Encounter for adjustment and management of automatic implantable cardiac defibrillator: Secondary | ICD-10-CM | POA: Diagnosis not present

## 2016-04-05 DIAGNOSIS — Z950 Presence of cardiac pacemaker: Secondary | ICD-10-CM | POA: Diagnosis not present

## 2016-05-09 DIAGNOSIS — R079 Chest pain, unspecified: Secondary | ICD-10-CM | POA: Diagnosis not present

## 2016-05-09 DIAGNOSIS — R0609 Other forms of dyspnea: Secondary | ICD-10-CM | POA: Diagnosis not present

## 2016-05-09 DIAGNOSIS — R0600 Dyspnea, unspecified: Secondary | ICD-10-CM | POA: Diagnosis not present

## 2016-05-09 DIAGNOSIS — Z7982 Long term (current) use of aspirin: Secondary | ICD-10-CM | POA: Diagnosis not present

## 2016-05-09 DIAGNOSIS — Z79899 Other long term (current) drug therapy: Secondary | ICD-10-CM | POA: Diagnosis not present

## 2016-05-14 DIAGNOSIS — E785 Hyperlipidemia, unspecified: Secondary | ICD-10-CM | POA: Diagnosis not present

## 2016-05-14 DIAGNOSIS — R0789 Other chest pain: Secondary | ICD-10-CM | POA: Diagnosis not present

## 2016-05-14 DIAGNOSIS — Z950 Presence of cardiac pacemaker: Secondary | ICD-10-CM | POA: Diagnosis not present

## 2016-05-14 DIAGNOSIS — Z7982 Long term (current) use of aspirin: Secondary | ICD-10-CM | POA: Diagnosis not present

## 2016-05-14 DIAGNOSIS — I4891 Unspecified atrial fibrillation: Secondary | ICD-10-CM | POA: Diagnosis not present

## 2016-05-14 DIAGNOSIS — R091 Pleurisy: Secondary | ICD-10-CM | POA: Diagnosis not present

## 2016-05-14 DIAGNOSIS — Z79899 Other long term (current) drug therapy: Secondary | ICD-10-CM | POA: Diagnosis not present

## 2016-05-14 DIAGNOSIS — R079 Chest pain, unspecified: Secondary | ICD-10-CM | POA: Diagnosis not present

## 2016-05-21 DIAGNOSIS — M7542 Impingement syndrome of left shoulder: Secondary | ICD-10-CM | POA: Diagnosis not present

## 2016-05-21 DIAGNOSIS — M7582 Other shoulder lesions, left shoulder: Secondary | ICD-10-CM | POA: Diagnosis not present

## 2016-05-21 DIAGNOSIS — Z6841 Body Mass Index (BMI) 40.0 and over, adult: Secondary | ICD-10-CM | POA: Diagnosis not present

## 2016-05-21 DIAGNOSIS — Z1389 Encounter for screening for other disorder: Secondary | ICD-10-CM | POA: Diagnosis not present

## 2016-05-23 DIAGNOSIS — Z6841 Body Mass Index (BMI) 40.0 and over, adult: Secondary | ICD-10-CM | POA: Diagnosis not present

## 2016-05-23 DIAGNOSIS — R0602 Shortness of breath: Secondary | ICD-10-CM | POA: Diagnosis not present

## 2016-05-23 DIAGNOSIS — R0609 Other forms of dyspnea: Secondary | ICD-10-CM | POA: Diagnosis not present

## 2016-05-23 DIAGNOSIS — Z950 Presence of cardiac pacemaker: Secondary | ICD-10-CM | POA: Diagnosis not present

## 2016-05-23 DIAGNOSIS — I4891 Unspecified atrial fibrillation: Secondary | ICD-10-CM | POA: Diagnosis not present

## 2016-05-23 DIAGNOSIS — I739 Peripheral vascular disease, unspecified: Secondary | ICD-10-CM | POA: Diagnosis not present

## 2016-05-24 DIAGNOSIS — M25612 Stiffness of left shoulder, not elsewhere classified: Secondary | ICD-10-CM | POA: Diagnosis not present

## 2016-05-24 DIAGNOSIS — M6281 Muscle weakness (generalized): Secondary | ICD-10-CM | POA: Diagnosis not present

## 2016-05-27 DIAGNOSIS — N62 Hypertrophy of breast: Secondary | ICD-10-CM | POA: Diagnosis not present

## 2016-05-27 DIAGNOSIS — Z7901 Long term (current) use of anticoagulants: Secondary | ICD-10-CM | POA: Diagnosis not present

## 2016-05-27 DIAGNOSIS — M545 Low back pain: Secondary | ICD-10-CM | POA: Diagnosis not present

## 2016-05-27 DIAGNOSIS — I10 Essential (primary) hypertension: Secondary | ICD-10-CM | POA: Diagnosis not present

## 2016-05-27 DIAGNOSIS — R5383 Other fatigue: Secondary | ICD-10-CM | POA: Diagnosis not present

## 2016-05-27 DIAGNOSIS — Z6841 Body Mass Index (BMI) 40.0 and over, adult: Secondary | ICD-10-CM | POA: Diagnosis not present

## 2016-05-28 DIAGNOSIS — E8881 Metabolic syndrome: Secondary | ICD-10-CM | POA: Diagnosis not present

## 2016-05-28 DIAGNOSIS — R5383 Other fatigue: Secondary | ICD-10-CM | POA: Diagnosis not present

## 2016-05-28 DIAGNOSIS — M25612 Stiffness of left shoulder, not elsewhere classified: Secondary | ICD-10-CM | POA: Diagnosis not present

## 2016-05-28 DIAGNOSIS — M6281 Muscle weakness (generalized): Secondary | ICD-10-CM | POA: Diagnosis not present

## 2016-05-31 DIAGNOSIS — M6281 Muscle weakness (generalized): Secondary | ICD-10-CM | POA: Diagnosis not present

## 2016-05-31 DIAGNOSIS — M25612 Stiffness of left shoulder, not elsewhere classified: Secondary | ICD-10-CM | POA: Diagnosis not present

## 2016-06-12 DIAGNOSIS — B2790 Infectious mononucleosis, unspecified without complication: Secondary | ICD-10-CM | POA: Diagnosis not present

## 2016-06-12 DIAGNOSIS — Z6841 Body Mass Index (BMI) 40.0 and over, adult: Secondary | ICD-10-CM | POA: Diagnosis not present

## 2016-06-12 DIAGNOSIS — G4730 Sleep apnea, unspecified: Secondary | ICD-10-CM | POA: Diagnosis not present

## 2016-06-12 DIAGNOSIS — E349 Endocrine disorder, unspecified: Secondary | ICD-10-CM | POA: Diagnosis not present

## 2016-07-03 DIAGNOSIS — Z45018 Encounter for adjustment and management of other part of cardiac pacemaker: Secondary | ICD-10-CM | POA: Diagnosis not present

## 2016-07-03 DIAGNOSIS — Z950 Presence of cardiac pacemaker: Secondary | ICD-10-CM | POA: Diagnosis not present

## 2016-07-17 DIAGNOSIS — R29890 Loss of height: Secondary | ICD-10-CM | POA: Diagnosis not present

## 2016-07-31 DIAGNOSIS — Z1211 Encounter for screening for malignant neoplasm of colon: Secondary | ICD-10-CM | POA: Diagnosis not present

## 2016-07-31 DIAGNOSIS — I10 Essential (primary) hypertension: Secondary | ICD-10-CM | POA: Diagnosis not present

## 2016-07-31 DIAGNOSIS — E8881 Metabolic syndrome: Secondary | ICD-10-CM | POA: Diagnosis not present

## 2016-07-31 DIAGNOSIS — R5383 Other fatigue: Secondary | ICD-10-CM | POA: Diagnosis not present

## 2016-08-08 DIAGNOSIS — R5383 Other fatigue: Secondary | ICD-10-CM | POA: Diagnosis not present

## 2016-08-08 DIAGNOSIS — N50819 Testicular pain, unspecified: Secondary | ICD-10-CM | POA: Diagnosis not present

## 2016-08-08 DIAGNOSIS — K5900 Constipation, unspecified: Secondary | ICD-10-CM | POA: Diagnosis not present

## 2016-08-08 DIAGNOSIS — R140 Abdominal distension (gaseous): Secondary | ICD-10-CM | POA: Diagnosis not present

## 2016-08-08 DIAGNOSIS — R609 Edema, unspecified: Secondary | ICD-10-CM | POA: Diagnosis not present

## 2016-08-08 DIAGNOSIS — Z6841 Body Mass Index (BMI) 40.0 and over, adult: Secondary | ICD-10-CM | POA: Diagnosis not present

## 2016-08-08 DIAGNOSIS — I2510 Atherosclerotic heart disease of native coronary artery without angina pectoris: Secondary | ICD-10-CM | POA: Diagnosis not present

## 2016-08-08 DIAGNOSIS — M545 Low back pain: Secondary | ICD-10-CM | POA: Diagnosis not present

## 2016-08-15 DIAGNOSIS — E8881 Metabolic syndrome: Secondary | ICD-10-CM | POA: Diagnosis not present

## 2016-08-15 DIAGNOSIS — Z6841 Body Mass Index (BMI) 40.0 and over, adult: Secondary | ICD-10-CM | POA: Diagnosis not present

## 2016-08-15 DIAGNOSIS — B2790 Infectious mononucleosis, unspecified without complication: Secondary | ICD-10-CM | POA: Diagnosis not present

## 2016-08-15 DIAGNOSIS — E349 Endocrine disorder, unspecified: Secondary | ICD-10-CM | POA: Diagnosis not present

## 2016-08-15 DIAGNOSIS — E7800 Pure hypercholesterolemia, unspecified: Secondary | ICD-10-CM | POA: Diagnosis not present

## 2016-08-15 DIAGNOSIS — M545 Low back pain: Secondary | ICD-10-CM | POA: Diagnosis not present

## 2016-08-21 DIAGNOSIS — G4733 Obstructive sleep apnea (adult) (pediatric): Secondary | ICD-10-CM | POA: Diagnosis not present

## 2016-08-21 DIAGNOSIS — E669 Obesity, unspecified: Secondary | ICD-10-CM | POA: Diagnosis not present

## 2016-09-09 DIAGNOSIS — Z01810 Encounter for preprocedural cardiovascular examination: Secondary | ICD-10-CM | POA: Diagnosis not present

## 2016-09-09 DIAGNOSIS — E785 Hyperlipidemia, unspecified: Secondary | ICD-10-CM | POA: Diagnosis not present

## 2016-09-09 DIAGNOSIS — R0602 Shortness of breath: Secondary | ICD-10-CM | POA: Diagnosis not present

## 2016-09-09 DIAGNOSIS — I2510 Atherosclerotic heart disease of native coronary artery without angina pectoris: Secondary | ICD-10-CM | POA: Diagnosis not present

## 2016-09-09 DIAGNOSIS — Z950 Presence of cardiac pacemaker: Secondary | ICD-10-CM | POA: Diagnosis not present

## 2016-10-02 DIAGNOSIS — Z45018 Encounter for adjustment and management of other part of cardiac pacemaker: Secondary | ICD-10-CM | POA: Diagnosis not present

## 2016-10-02 DIAGNOSIS — Z950 Presence of cardiac pacemaker: Secondary | ICD-10-CM | POA: Diagnosis not present

## 2016-11-26 DIAGNOSIS — Z0000 Encounter for general adult medical examination without abnormal findings: Secondary | ICD-10-CM | POA: Diagnosis not present

## 2016-11-26 DIAGNOSIS — Z6839 Body mass index (BMI) 39.0-39.9, adult: Secondary | ICD-10-CM | POA: Diagnosis not present

## 2016-12-03 DIAGNOSIS — Z13818 Encounter for screening for other digestive system disorders: Secondary | ICD-10-CM | POA: Diagnosis not present

## 2016-12-03 DIAGNOSIS — Z1159 Encounter for screening for other viral diseases: Secondary | ICD-10-CM | POA: Diagnosis not present

## 2017-01-01 DIAGNOSIS — L0390 Cellulitis, unspecified: Secondary | ICD-10-CM | POA: Diagnosis not present

## 2017-01-03 DIAGNOSIS — Z45018 Encounter for adjustment and management of other part of cardiac pacemaker: Secondary | ICD-10-CM | POA: Diagnosis not present

## 2017-01-03 DIAGNOSIS — I495 Sick sinus syndrome: Secondary | ICD-10-CM | POA: Diagnosis not present

## 2017-02-03 DIAGNOSIS — L821 Other seborrheic keratosis: Secondary | ICD-10-CM | POA: Diagnosis not present

## 2017-02-03 DIAGNOSIS — L570 Actinic keratosis: Secondary | ICD-10-CM | POA: Diagnosis not present

## 2017-02-03 DIAGNOSIS — D1801 Hemangioma of skin and subcutaneous tissue: Secondary | ICD-10-CM | POA: Diagnosis not present

## 2017-02-03 DIAGNOSIS — L820 Inflamed seborrheic keratosis: Secondary | ICD-10-CM | POA: Diagnosis not present

## 2017-02-03 DIAGNOSIS — L538 Other specified erythematous conditions: Secondary | ICD-10-CM | POA: Diagnosis not present

## 2017-02-03 DIAGNOSIS — L738 Other specified follicular disorders: Secondary | ICD-10-CM | POA: Diagnosis not present

## 2017-02-03 DIAGNOSIS — I872 Venous insufficiency (chronic) (peripheral): Secondary | ICD-10-CM | POA: Diagnosis not present

## 2017-03-04 DIAGNOSIS — G4710 Hypersomnia, unspecified: Secondary | ICD-10-CM | POA: Diagnosis not present

## 2017-03-12 DIAGNOSIS — I482 Chronic atrial fibrillation: Secondary | ICD-10-CM | POA: Diagnosis not present

## 2017-03-12 DIAGNOSIS — R600 Localized edema: Secondary | ICD-10-CM | POA: Diagnosis not present

## 2017-03-12 DIAGNOSIS — Z950 Presence of cardiac pacemaker: Secondary | ICD-10-CM | POA: Diagnosis not present

## 2017-03-12 DIAGNOSIS — E785 Hyperlipidemia, unspecified: Secondary | ICD-10-CM | POA: Diagnosis not present

## 2017-03-12 DIAGNOSIS — I4891 Unspecified atrial fibrillation: Secondary | ICD-10-CM | POA: Diagnosis not present

## 2017-03-12 DIAGNOSIS — Z6841 Body Mass Index (BMI) 40.0 and over, adult: Secondary | ICD-10-CM | POA: Diagnosis not present

## 2017-03-12 DIAGNOSIS — Z79899 Other long term (current) drug therapy: Secondary | ICD-10-CM | POA: Diagnosis not present

## 2017-03-14 DIAGNOSIS — I4891 Unspecified atrial fibrillation: Secondary | ICD-10-CM | POA: Diagnosis not present

## 2017-03-14 DIAGNOSIS — R600 Localized edema: Secondary | ICD-10-CM | POA: Diagnosis not present

## 2017-03-14 DIAGNOSIS — I081 Rheumatic disorders of both mitral and tricuspid valves: Secondary | ICD-10-CM | POA: Diagnosis not present

## 2017-03-14 DIAGNOSIS — I361 Nonrheumatic tricuspid (valve) insufficiency: Secondary | ICD-10-CM | POA: Diagnosis not present

## 2017-03-14 DIAGNOSIS — I517 Cardiomegaly: Secondary | ICD-10-CM | POA: Diagnosis not present

## 2017-03-14 DIAGNOSIS — I340 Nonrheumatic mitral (valve) insufficiency: Secondary | ICD-10-CM | POA: Diagnosis not present

## 2017-03-14 DIAGNOSIS — Z950 Presence of cardiac pacemaker: Secondary | ICD-10-CM | POA: Diagnosis not present

## 2017-04-09 DIAGNOSIS — Z45018 Encounter for adjustment and management of other part of cardiac pacemaker: Secondary | ICD-10-CM | POA: Diagnosis not present

## 2017-04-09 DIAGNOSIS — Z950 Presence of cardiac pacemaker: Secondary | ICD-10-CM | POA: Diagnosis not present

## 2017-04-09 DIAGNOSIS — I495 Sick sinus syndrome: Secondary | ICD-10-CM | POA: Diagnosis not present

## 2017-04-14 DIAGNOSIS — Z79899 Other long term (current) drug therapy: Secondary | ICD-10-CM | POA: Diagnosis not present

## 2017-04-14 DIAGNOSIS — R5383 Other fatigue: Secondary | ICD-10-CM | POA: Diagnosis not present

## 2017-04-14 DIAGNOSIS — Z6841 Body Mass Index (BMI) 40.0 and over, adult: Secondary | ICD-10-CM | POA: Diagnosis not present

## 2017-04-14 DIAGNOSIS — I4891 Unspecified atrial fibrillation: Secondary | ICD-10-CM | POA: Diagnosis not present

## 2017-04-14 DIAGNOSIS — Z950 Presence of cardiac pacemaker: Secondary | ICD-10-CM | POA: Diagnosis not present

## 2017-04-23 DIAGNOSIS — G4733 Obstructive sleep apnea (adult) (pediatric): Secondary | ICD-10-CM | POA: Diagnosis not present

## 2017-04-23 DIAGNOSIS — E6609 Other obesity due to excess calories: Secondary | ICD-10-CM | POA: Diagnosis not present

## 2017-04-23 DIAGNOSIS — J310 Chronic rhinitis: Secondary | ICD-10-CM | POA: Diagnosis not present

## 2017-05-26 DIAGNOSIS — Z6841 Body Mass Index (BMI) 40.0 and over, adult: Secondary | ICD-10-CM | POA: Diagnosis not present

## 2017-05-26 DIAGNOSIS — I499 Cardiac arrhythmia, unspecified: Secondary | ICD-10-CM | POA: Diagnosis not present

## 2017-05-26 DIAGNOSIS — M545 Low back pain: Secondary | ICD-10-CM | POA: Diagnosis not present

## 2017-05-26 DIAGNOSIS — B2790 Infectious mononucleosis, unspecified without complication: Secondary | ICD-10-CM | POA: Diagnosis not present

## 2017-05-26 DIAGNOSIS — I10 Essential (primary) hypertension: Secondary | ICD-10-CM | POA: Diagnosis not present

## 2017-05-26 DIAGNOSIS — E8881 Metabolic syndrome: Secondary | ICD-10-CM | POA: Diagnosis not present

## 2017-05-29 DIAGNOSIS — H43391 Other vitreous opacities, right eye: Secondary | ICD-10-CM | POA: Diagnosis not present

## 2017-05-29 DIAGNOSIS — H353131 Nonexudative age-related macular degeneration, bilateral, early dry stage: Secondary | ICD-10-CM | POA: Diagnosis not present

## 2017-05-29 DIAGNOSIS — H524 Presbyopia: Secondary | ICD-10-CM | POA: Diagnosis not present

## 2017-05-29 DIAGNOSIS — H35363 Drusen (degenerative) of macula, bilateral: Secondary | ICD-10-CM | POA: Diagnosis not present

## 2017-06-24 DIAGNOSIS — R5383 Other fatigue: Secondary | ICD-10-CM | POA: Diagnosis not present

## 2017-06-24 DIAGNOSIS — Z79899 Other long term (current) drug therapy: Secondary | ICD-10-CM | POA: Diagnosis not present

## 2017-06-24 DIAGNOSIS — Z6841 Body Mass Index (BMI) 40.0 and over, adult: Secondary | ICD-10-CM | POA: Diagnosis not present

## 2017-06-24 DIAGNOSIS — I4891 Unspecified atrial fibrillation: Secondary | ICD-10-CM | POA: Diagnosis not present

## 2017-06-24 DIAGNOSIS — Z950 Presence of cardiac pacemaker: Secondary | ICD-10-CM | POA: Diagnosis not present

## 2017-06-24 DIAGNOSIS — R0602 Shortness of breath: Secondary | ICD-10-CM | POA: Diagnosis not present

## 2017-06-24 DIAGNOSIS — R9439 Abnormal result of other cardiovascular function study: Secondary | ICD-10-CM | POA: Diagnosis not present

## 2017-07-02 DIAGNOSIS — M961 Postlaminectomy syndrome, not elsewhere classified: Secondary | ICD-10-CM | POA: Diagnosis not present

## 2017-07-02 DIAGNOSIS — M545 Low back pain: Secondary | ICD-10-CM | POA: Diagnosis not present

## 2017-07-02 DIAGNOSIS — M170 Bilateral primary osteoarthritis of knee: Secondary | ICD-10-CM | POA: Diagnosis not present

## 2017-07-04 DIAGNOSIS — R0602 Shortness of breath: Secondary | ICD-10-CM | POA: Diagnosis not present

## 2017-07-04 DIAGNOSIS — M5136 Other intervertebral disc degeneration, lumbar region: Secondary | ICD-10-CM | POA: Diagnosis not present

## 2017-07-04 DIAGNOSIS — M47816 Spondylosis without myelopathy or radiculopathy, lumbar region: Secondary | ICD-10-CM | POA: Diagnosis not present

## 2017-07-04 DIAGNOSIS — M47896 Other spondylosis, lumbar region: Secondary | ICD-10-CM | POA: Diagnosis not present

## 2017-07-04 DIAGNOSIS — M545 Low back pain: Secondary | ICD-10-CM | POA: Diagnosis not present

## 2017-07-09 DIAGNOSIS — Z45018 Encounter for adjustment and management of other part of cardiac pacemaker: Secondary | ICD-10-CM | POA: Diagnosis not present

## 2017-07-09 DIAGNOSIS — Z950 Presence of cardiac pacemaker: Secondary | ICD-10-CM | POA: Diagnosis not present

## 2017-07-23 DIAGNOSIS — M545 Low back pain: Secondary | ICD-10-CM | POA: Diagnosis not present

## 2017-07-23 DIAGNOSIS — M47816 Spondylosis without myelopathy or radiculopathy, lumbar region: Secondary | ICD-10-CM | POA: Diagnosis not present

## 2017-08-29 DIAGNOSIS — M545 Low back pain: Secondary | ICD-10-CM | POA: Diagnosis not present

## 2017-09-09 DIAGNOSIS — M545 Low back pain: Secondary | ICD-10-CM | POA: Diagnosis not present

## 2017-09-11 DIAGNOSIS — M545 Low back pain: Secondary | ICD-10-CM | POA: Diagnosis not present

## 2017-09-12 DIAGNOSIS — Z45018 Encounter for adjustment and management of other part of cardiac pacemaker: Secondary | ICD-10-CM | POA: Diagnosis not present

## 2017-09-16 DIAGNOSIS — M545 Low back pain: Secondary | ICD-10-CM | POA: Diagnosis not present

## 2017-09-23 DIAGNOSIS — M545 Low back pain: Secondary | ICD-10-CM | POA: Diagnosis not present

## 2017-09-25 DIAGNOSIS — M545 Low back pain: Secondary | ICD-10-CM | POA: Diagnosis not present

## 2017-09-30 DIAGNOSIS — M545 Low back pain: Secondary | ICD-10-CM | POA: Diagnosis not present

## 2017-10-02 DIAGNOSIS — M545 Low back pain: Secondary | ICD-10-CM | POA: Diagnosis not present

## 2017-10-07 DIAGNOSIS — M545 Low back pain: Secondary | ICD-10-CM | POA: Diagnosis not present

## 2017-10-09 DIAGNOSIS — M545 Low back pain: Secondary | ICD-10-CM | POA: Diagnosis not present

## 2017-10-14 DIAGNOSIS — M545 Low back pain: Secondary | ICD-10-CM | POA: Diagnosis not present

## 2017-10-15 DIAGNOSIS — E6609 Other obesity due to excess calories: Secondary | ICD-10-CM | POA: Diagnosis not present

## 2017-10-15 DIAGNOSIS — G4733 Obstructive sleep apnea (adult) (pediatric): Secondary | ICD-10-CM | POA: Diagnosis not present
